# Patient Record
Sex: Female | Born: 1990 | Race: White | Hispanic: No | Marital: Married | Smoking: Never smoker
Health system: Southern US, Community
[De-identification: ages and names within clinical notes are randomized; demographics above are authoritative.]

## PROBLEM LIST (undated history)

## (undated) ENCOUNTER — Inpatient Hospital Stay (HOSPITAL_COMMUNITY): Payer: 59 | Source: Ambulatory Visit

## (undated) DIAGNOSIS — D649 Anemia, unspecified: Secondary | ICD-10-CM

---
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
## 2010-02-14 IMAGING — CT CT L SPINE W/O CM
1 series · 12 of 14 positions shown, 15 images · non-contrast
Comparison: None available

CLINICAL DATA: Lower lumbar pain for 3 weeks table.  Not on back
unable to walk the

CT LUMBAR SPINE WITHOUT CONTRAST
TECHNIQUE: Multidetector CT imaging of the lumbar spine was
performed without intravenous contrast administration. Multiplanar
CT image reconstructions were also generated.

[Series 2: lumbar spine 3.0 b30s · axial · 0.29mm/px · z∈[-382,-190]mm · 12 of 76 slices shown, 15 images]
[im 6/76  soft-tissue]
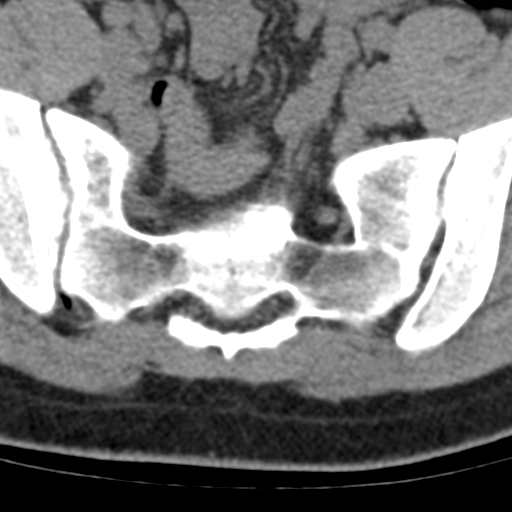
[im 6/76  bone]
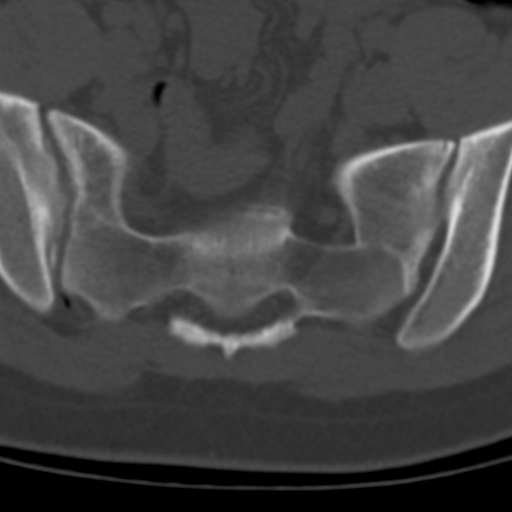
[im 12/76  bone]
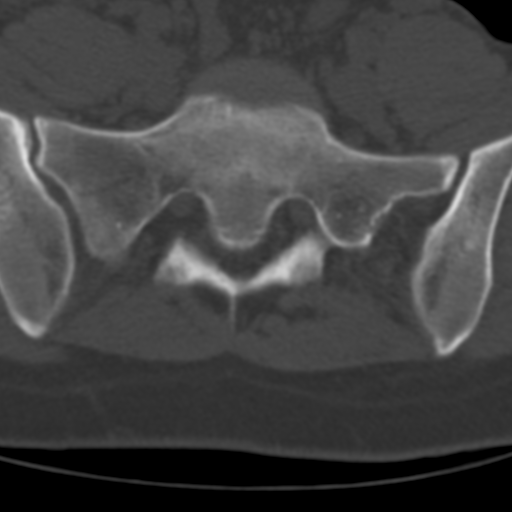
[im 18/76  bone]
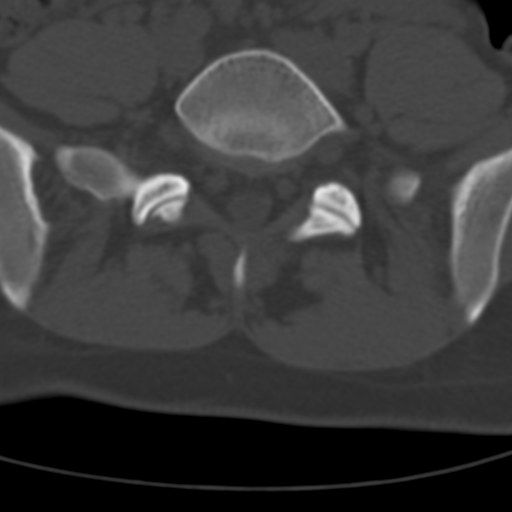
[im 24/76  bone]
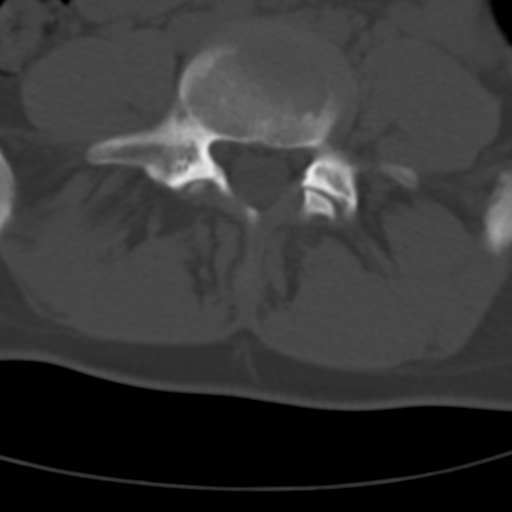
[im 29/76  soft-tissue]
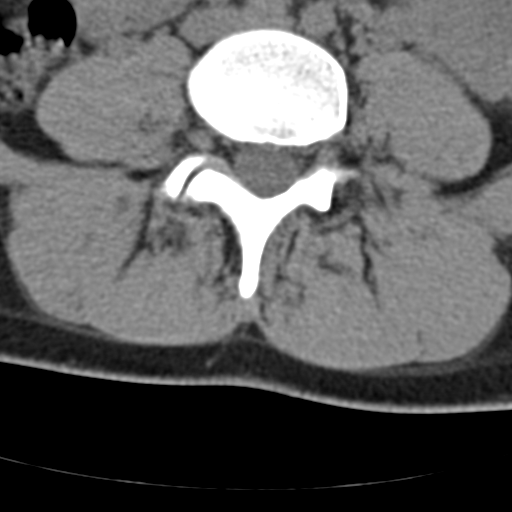
[im 29/76  bone]
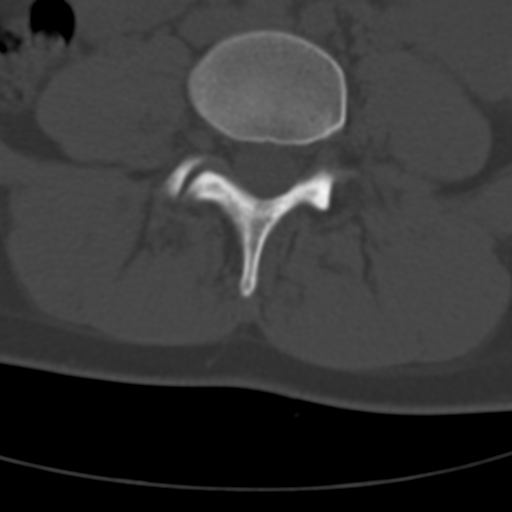
[im 35/76  bone]
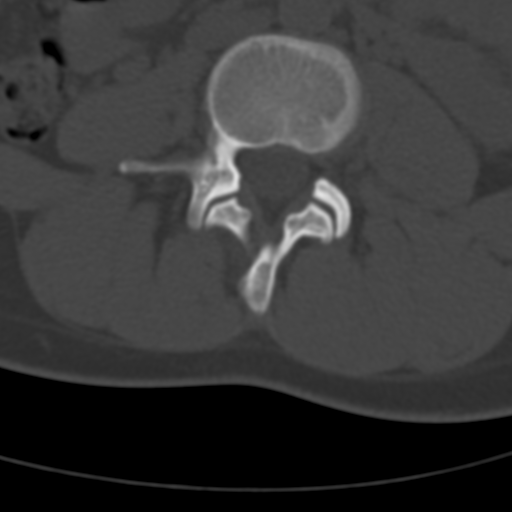
[im 41/76  bone]
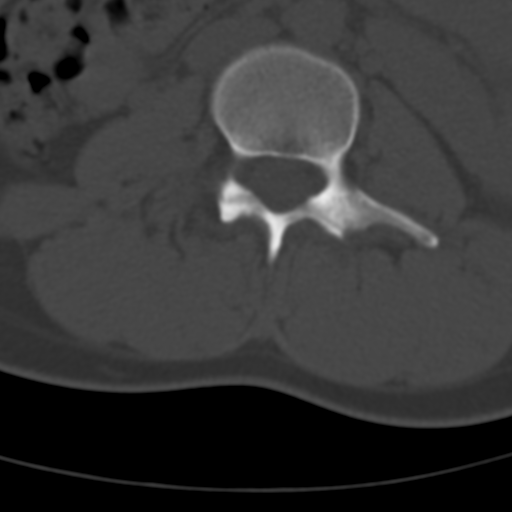
[im 47/76  bone]
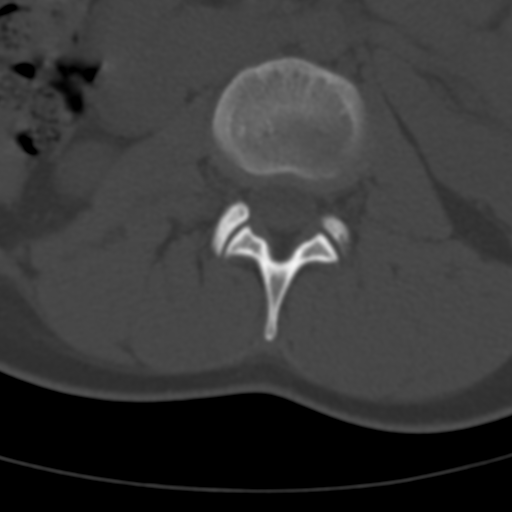
[im 52/76  soft-tissue]
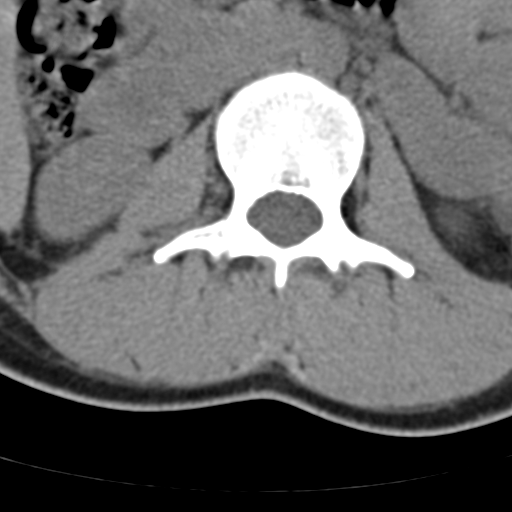
[im 52/76  bone]
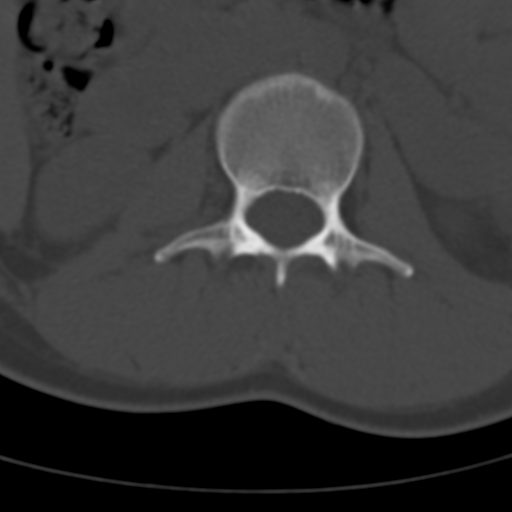
[im 58/76  bone]
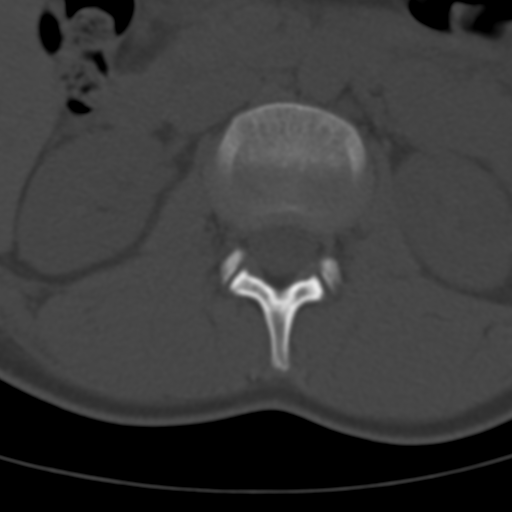
[im 64/76  bone]
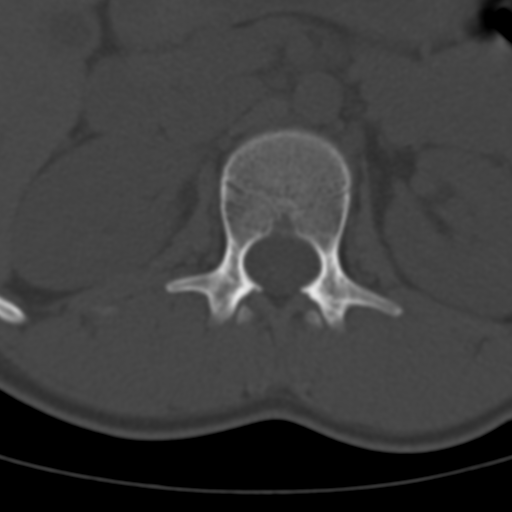
[im 70/76  bone]
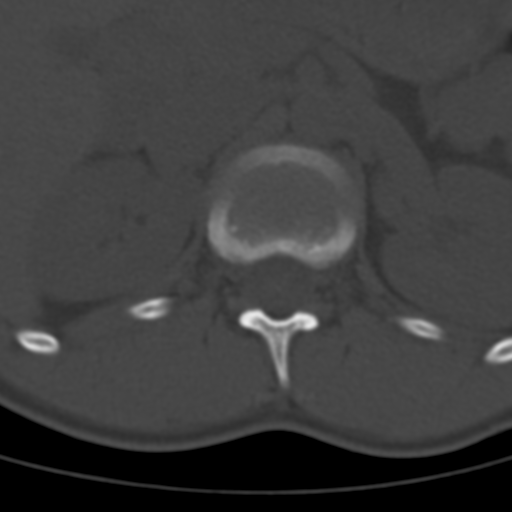

[12 of 14 positions shown; findings below may reference images not displayed]

FINDINGS: Lucencies through the  pars interarticularis at L3 on the
left and right consist with bilateral spondylolysis at this level.
No evidence of subluxation.  Normal alignment of the vertebral
bodies.  No evidence of canal stenosis.  Small left paracentral
disc bulge at L4-L5 with mild lateral recess stenosis.  The sacrum
and SI joints appear normal.
IMPRESSION: 1..  Bilateral spondylolysis at L3 without evidence of
spondylolisthesis.

2.  Small left paracentral disc bulge at L4-L5 with mild lateral
recess stenosis.

Finding conveyed to Dr Kwika 08/18/2007 .

## 2014-09-27 ENCOUNTER — Emergency Department (HOSPITAL_COMMUNITY): Payer: BLUE CROSS/BLUE SHIELD

## 2014-09-27 ENCOUNTER — Encounter (HOSPITAL_COMMUNITY): Payer: Self-pay | Admitting: *Deleted

## 2014-09-27 ENCOUNTER — Emergency Department (HOSPITAL_COMMUNITY)
Admission: EM | Admit: 2014-09-27 | Discharge: 2014-09-27 | Disposition: A | Discharge status: Home / Self Care | Payer: BLUE CROSS/BLUE SHIELD | Attending: Emergency Medicine | Admitting: Emergency Medicine

## 2014-09-27 ENCOUNTER — Emergency Department (HOSPITAL_COMMUNITY): Admission: EM | Admit: 2014-09-27 | Discharge: 2014-09-27 | Discharge status: Absent without leave | Payer: Self-pay

## 2014-09-27 DIAGNOSIS — R1030 Lower abdominal pain, unspecified: Secondary | ICD-10-CM

## 2014-09-27 DIAGNOSIS — R630 Anorexia: Secondary | ICD-10-CM | POA: Diagnosis not present

## 2014-09-27 DIAGNOSIS — N760 Acute vaginitis: Secondary | ICD-10-CM | POA: Insufficient documentation

## 2014-09-27 DIAGNOSIS — R1031 Right lower quadrant pain: Secondary | ICD-10-CM | POA: Insufficient documentation

## 2014-09-27 DIAGNOSIS — Z79899 Other long term (current) drug therapy: Secondary | ICD-10-CM | POA: Insufficient documentation

## 2014-09-27 DIAGNOSIS — B9689 Other specified bacterial agents as the cause of diseases classified elsewhere: Secondary | ICD-10-CM

## 2014-09-27 LAB — COMPREHENSIVE METABOLIC PANEL
ALK PHOS: 61 U/L (ref 38–126)
ALT: 28 U/L (ref 14–54)
ANION GAP: 9 (ref 5–15)
AST: 23 U/L (ref 15–41)
Albumin: 4.1 g/dL (ref 3.5–5.0)
BUN: 6 mg/dL (ref 6–20)
CALCIUM: 9.3 mg/dL (ref 8.9–10.3)
CO2: 27 mmol/L (ref 22–32)
CREATININE: 0.78 mg/dL (ref 0.44–1.00)
Chloride: 102 mmol/L (ref 101–111)
GFR calc Af Amer: >60 mL/min (ref >60)
GLUCOSE: 95 mg/dL (ref 65–99)
Potassium: 4.1 mmol/L (ref 3.5–5.1)
SODIUM: 138 mmol/L (ref 135–145)
Total Bilirubin: 0.6 mg/dL (ref 0.3–1.2)
Total Protein: 7 g/dL (ref 6.5–8.1)

## 2014-09-27 LAB — CBC WITH DIFFERENTIAL/PLATELET
BASOS ABS: 0 10*3/uL (ref 0.0–0.1)
BASOS PCT: 0 % (ref 0–1)
EOS PCT: 1 % (ref 0–5)
Eosinophils Absolute: 0.1 10*3/uL (ref 0.0–0.7)
HCT: 40.1 % (ref 36.0–46.0)
Hemoglobin: 14.1 g/dL (ref 12.0–15.0)
Lymphocytes Relative: 24 % (ref 12–46)
Lymphs Abs: 1.7 10*3/uL (ref 0.7–4.0)
MCH: 30.7 pg (ref 26.0–34.0)
MCHC: 35.2 g/dL (ref 30.0–36.0)
MCV: 87.4 fL (ref 78.0–100.0)
MONOS PCT: 5 % (ref 3–12)
Monocytes Absolute: 0.3 10*3/uL (ref 0.1–1.0)
Neutro Abs: 4.8 10*3/uL (ref 1.7–7.7)
Neutrophils Relative %: 70 % (ref 43–77)
PLATELETS: 170 10*3/uL (ref 150–400)
RBC: 4.59 MIL/uL (ref 3.87–5.11)
RDW: 12.1 % (ref 11.5–15.5)
WBC: 6.8 10*3/uL (ref 4.0–10.5)

## 2014-09-27 LAB — URINALYSIS, ROUTINE W REFLEX MICROSCOPIC
BILIRUBIN URINE: NEGATIVE
Glucose, UA: NEGATIVE mg/dL
Hgb urine dipstick: NEGATIVE
Ketones, ur: NEGATIVE mg/dL
LEUKOCYTES UA: NEGATIVE
NITRITE: NEGATIVE
PH: 6 (ref 5.0–8.0)
Protein, ur: NEGATIVE mg/dL
Specific Gravity, Urine: 1.006 (ref 1.005–1.030)
Urobilinogen, UA: 0.2 mg/dL (ref 0.0–1.0)

## 2014-09-27 LAB — WET PREP, GENITAL
TRICH WET PREP: NONE SEEN
Yeast Wet Prep HPF POC: NONE SEEN

## 2014-09-27 LAB — I-STAT BETA HCG BLOOD, ED (MC, WL, AP ONLY): I-stat hCG, quantitative: <5 m[IU]/mL (ref <5)

## 2014-09-27 LAB — LIPASE, BLOOD: Lipase: 15 U/L — ABNORMAL LOW (ref 22–51)

## 2014-09-27 MED ORDER — METRONIDAZOLE 500 MG PO TABS: 500.0000 mg | ORAL_TABLET | Freq: Two times a day (BID) | ORAL | Status: DC

## 2014-09-27 MED ORDER — TRAMADOL HCL 50 MG PO TABS
50.0000 mg | ORAL_TABLET | Freq: Four times a day (QID) | ORAL | Status: DC | PRN
Start: 2014-09-27 — End: 2016-02-21

## 2014-09-27 MED ORDER — ONDANSETRON 4 MG PO TBDP: ORAL_TABLET | ORAL | Status: DC

## 2014-09-27 MED ORDER — ONDANSETRON HCL 4 MG/2ML IJ SOLN
4.0000 mg | Freq: Once | INTRAMUSCULAR | Status: AC
  Administered 2014-09-27: 4 mg via INTRAVENOUS
  Filled 2014-09-27: qty 2

## 2014-09-27 MED ORDER — IOHEXOL 300 MG/ML  SOLN
100.0000 mL | Freq: Once | INTRAMUSCULAR | Status: AC | PRN
  Administered 2014-09-27: 100 mL via INTRAVENOUS

## 2014-09-27 NOTE — Discharge Instructions (Signed)
Take Flagyl twice daily for 1 week. Take tramadol as directed as needed for pain. Follow-up with your primary care physician. Take Zofran as directed as needed for nausea.  Abdominal Pain, Women Abdominal (stomach, pelvic, or belly) pain can be caused by many things. It is important to tell your doctor:  The location of the pain.  Does it come and go or is it present all the time?  Are there things that start the pain (eating certain foods, exercise)?  Are there other symptoms associated with the pain (fever, nausea, vomiting, diarrhea)? All of this is helpful to know when trying to find the cause of the pain. CAUSES   Stomach: virus or bacteria infection, or ulcer.  Intestine: appendicitis (inflamed appendix), regional ileitis (Crohn's disease), ulcerative colitis (inflamed colon), irritable bowel syndrome, diverticulitis (inflamed diverticulum of the colon), or cancer of the stomach or intestine.  Gallbladder disease or stones in the gallbladder.  Kidney disease, kidney stones, or infection.  Pancreas infection or cancer.  Fibromyalgia (pain disorder).  Diseases of the female organs:  Uterus: fibroid (non-cancerous) tumors or infection.  Fallopian tubes: infection or tubal pregnancy.  Ovary: cysts or tumors.  Pelvic adhesions (scar tissue).  Endometriosis (uterus lining tissue growing in the pelvis and on the pelvic organs).  Pelvic congestion syndrome (female organs filling up with blood just before the menstrual period).  Pain with the menstrual period.  Pain with ovulation (producing an egg).  Pain with an IUD (intrauterine device, birth control) in the uterus.  Cancer of the female organs.  Functional pain (pain not caused by a disease, may improve without treatment).  Psychological pain.  Depression. DIAGNOSIS  Your doctor will decide the seriousness of your pain by doing an examination.  Blood tests.  X-rays.  Ultrasound.  CT scan (computed  tomography, special type of X-ray).  MRI (magnetic resonance imaging).  Cultures, for infection.  Barium enema (dye inserted in the large intestine, to better view it with X-rays).  Colonoscopy (looking in intestine with a lighted tube).  Laparoscopy (minor surgery, looking in abdomen with a lighted tube).  Major abdominal exploratory surgery (looking in abdomen with a large incision). TREATMENT  The treatment will depend on the cause of the pain.   Many cases can be observed and treated at home.  Over-the-counter medicines recommended by your caregiver.  Prescription medicine.  Antibiotics, for infection.  Birth control pills, for painful periods or for ovulation pain.  Hormone treatment, for endometriosis.  Nerve blocking injections.  Physical therapy.  Antidepressants.  Counseling with a psychologist or psychiatrist.  Minor or major surgery. HOME CARE INSTRUCTIONS   Do not take laxatives, unless directed by your caregiver.  Take over-the-counter pain medicine only if ordered by your caregiver. Do not take aspirin because it can cause an upset stomach or bleeding.  Try a clear liquid diet (broth or water) as ordered by your caregiver. Slowly move to a bland diet, as tolerated, if the pain is related to the stomach or intestine.  Have a thermometer and take your temperature several times a day, and record it.  Bed rest and sleep, if it helps the pain.  Avoid sexual intercourse, if it causes pain.  Avoid stressful situations.  Keep your follow-up appointments and tests, as your caregiver orders.  If the pain does not go away with medicine or surgery, you may try:  Acupuncture.  Relaxation exercises (yoga, meditation).  Group therapy.  Counseling. SEEK MEDICAL CARE IF:   You notice certain foods  cause stomach pain.  Your home care treatment is not helping your pain.  You need stronger pain medicine.  You want your IUD removed.  You feel faint  or lightheaded.  You develop nausea and vomiting.  You develop a rash.  You are having side effects or an allergy to your medicine. SEEK IMMEDIATE MEDICAL CARE IF:   Your pain does not go away or gets worse.  You have a fever.  Your pain is felt only in portions of the abdomen. The right side could possibly be appendicitis. The left lower portion of the abdomen could be colitis or diverticulitis.  You are passing blood in your stools (bright red or black tarry stools, with or without vomiting).  You have blood in your urine.  You develop chills, with or without a fever.  You pass out. MAKE SURE YOU:   Understand these instructions.  Will watch your condition.  Will get help right away if you are not doing well or get worse. Document Released: 02/09/2007 Document Revised: 08/29/2013 Document Reviewed: 03/01/2009 [facility] Patient Information 2015 [place], [place]. This information is not intended to replace advice given to you by your health care provider. Make sure you discuss any questions you have with your health care provider.  Bacterial Vaginosis Bacterial vaginosis is a vaginal infection that occurs when the normal balance of bacteria in the vagina is disrupted. It results from an overgrowth of certain bacteria. This is the most common vaginal infection in women of childbearing age. Treatment is important to prevent complications, especially in pregnant women, as it can cause a premature delivery. CAUSES  Bacterial vaginosis is caused by an increase in harmful bacteria that are normally present in smaller amounts in the vagina. Several different kinds of bacteria can cause bacterial vaginosis. However, the reason that the condition develops is not fully understood. RISK FACTORS Certain activities or behaviors can put you at an increased risk of developing bacterial vaginosis, including:  Having a new sex partner or multiple sex partners.  Douching.  Using an  intrauterine device (IUD) for contraception. Women do not get bacterial vaginosis from toilet seats, bedding, swimming pools, or contact with objects around them. SIGNS AND SYMPTOMS  Some women with bacterial vaginosis have no signs or symptoms. Common symptoms include:  Grey vaginal discharge.  A fishlike odor with discharge, especially after sexual intercourse.  Itching or burning of the vagina and vulva.  Burning or pain with urination. DIAGNOSIS  Your health care provider will take a medical history and examine the vagina for signs of bacterial vaginosis. A sample of vaginal fluid may be taken. Your health care provider will look at this sample under a microscope to check for bacteria and abnormal cells. A vaginal pH test may also be done.  TREATMENT  Bacterial vaginosis may be treated with antibiotic medicines. These may be given in the form of a pill or a vaginal cream. A second round of antibiotics may be prescribed if the condition comes back after treatment.  HOME CARE INSTRUCTIONS   Only take over-the-counter or prescription medicines as directed by your health care provider.  If antibiotic medicine was prescribed, take it as directed. Make sure you finish it even if you start to feel better.  Do not have sex until treatment is completed.  Tell all sexual partners that you have a vaginal infection. They should see their health care provider and be treated if they have problems, such as a mild rash or itching.  Practice safe  sex by using condoms and only having one sex partner. SEEK MEDICAL CARE IF:   Your symptoms are not improving after 3 days of treatment.  You have increased discharge or pain.  You have a fever. MAKE SURE YOU:   Understand these instructions.  Will watch your condition.  Will get help right away if you are not doing well or get worse. FOR MORE INFORMATION  Centers for Disease Control and Prevention, Division of STD Prevention:  [employer] American Sexual Health Association (ASHA): www.ashastd.org  Document Released: 04/14/2005 Document Revised: 02/02/2013 Document Reviewed: 11/24/2012 [facility] Patient Information 2015 [place], [place]. This information is not intended to replace advice given to you by your health care provider. Make sure you discuss any questions you have with your health care provider.

## 2014-09-27 NOTE — ED Notes (Signed)
Pt declined pain meds at this time.

## 2014-09-27 NOTE — ED Provider Notes (Signed)
CSN: [id number]     Arrival date & time 09/27/14  1345 History  This chart was scribed for non-physician practitioner, Celene Skeen, working with Zadie Rhine, MD by Richarda Overlie, ED Scribe. This patient was seen in room TR02C/TR02C and the patient's care was started at 3:22 PM.  Chief Complaint  Patient presents with  . Abdominal Pain   The history is provided by the patient. No language interpreter was used.   HPI Comments: Cathy Pineda is a 24 y.o. female who presents to the Emergency Department complaining of constant, right lower abodminal pain that started last night. Pt states that she experiences constant, dull pain with intermittent sharp pain in her RLQ. She rates her pain as a 5/10 at this time. Pt states that since onset her pain has radiated to her right flank and right lower back. She reports associated intermittent nausea and states that she has not eaten today. Pt states that she experienced some burning with urination last night but has not experienced this today. Pt reports that she has noticed some minimal, clear vaginal discharge but denies any colored or odorous discharge. She states she had her period on August 08, 2014 but did not have her period in May. She states that her periods are typically irregular and says that she stopped taking her birth control, Implanon, in February of 2016. Pt reports that she is sexually active with her husband with no protection. She reports that she has taken pregnancy tests since onset which have all been negative. She denies fever, diarrhea or vomiting.    History reviewed. No pertinent past medical history. History reviewed. No pertinent past surgical history. No family history on file. History  Substance Use Topics  . Smoking status: Never Smoker   . Smokeless tobacco: Not on file  . Alcohol Use: Yes   OB History    No data available     Review of Systems  Constitutional: Positive for appetite change. Negative for fever.   Gastrointestinal: Positive for nausea and abdominal pain. Negative for vomiting and diarrhea.  Genitourinary: Positive for dysuria, flank pain and vaginal discharge. Negative for frequency.  Musculoskeletal: Positive for back pain.  All other systems reviewed and are negative.  Allergies  Review of patient's allergies indicates no known allergies.  Home Medications   Prior to Admission medications   Medication Sig Start Date End Date Taking? Authorizing Provider  metroNIDAZOLE (FLAGYL) 500 MG tablet Take 1 tablet (500 mg total) by mouth 2 (two) times daily. One po bid x 7 days 09/27/14   Kathrynn Speed, PA-C  ondansetron (ZOFRAN ODT) 4 MG disintegrating tablet  ODT q4 hours prn nausea/vomit 09/27/14   Breelyn Icard M Vincen Bejar, PA-C  traMADol (ULTRAM) 50 MG tablet Take 1 tablet (50 mg total) by mouth every 6 (six) hours as needed. 09/27/14   Antwanette Wesche M Jayvian Escoe, PA-C   BP 109/68 mmHg  Pulse 61  Temp(Src) 97.7 F (36.5 C) (Oral)  Resp 16  Ht  (1.626 m)  Wt 225 lb (102.059 kg)  BMI 38.60 kg/m2  SpO2 100%  LMP 08/08/2014 Physical Exam  Constitutional: She is oriented to person, place, and time. She appears well-developed and well-nourished. No distress.  Obese.  HENT:  Head: Normocephalic and atraumatic.  Mouth/Throat: Oropharynx is clear and moist.  Eyes: Conjunctivae and EOM are normal.  Neck: Normal range of motion. Neck supple.  Cardiovascular: Normal rate, regular rhythm and normal heart sounds.   Pulmonary/Chest: Effort normal and breath sounds normal. No  respiratory distress.  Abdominal: Normal appearance and bowel sounds are normal. She exhibits no distension. There is tenderness in the right lower quadrant and suprapubic area. There is no rigidity, no rebound, no guarding, no CVA tenderness, no tenderness at McBurney's point and negative Murphy's sign.  No peritoneal signs.  Genitourinary: Uterus normal. Cervix exhibits no motion tenderness, no discharge and no friability. Right adnexum  displays tenderness. Left adnexum displays no tenderness. No erythema, tenderness or bleeding in the vagina. Vaginal discharge (white) found.  Musculoskeletal: Normal range of motion. She exhibits no edema.  Neurological: She is alert and oriented to person, place, and time. No sensory deficit.  Skin: Skin is warm and dry.  Psychiatric: She has a normal mood and affect. Her behavior is normal.  Nursing note and vitals reviewed.   ED Course  Procedures   DIAGNOSTIC STUDIES: Oxygen Saturation is 99% on RA, normal by my interpretation.    COORDINATION OF CARE: 3:31 PM Discussed treatment plan with pt at bedside and pt agreed to plan.  Labs Review Labs Reviewed  WET PREP, GENITAL - Abnormal; Notable for the following:    Clue Cells Wet Prep HPF POC FEW (*)    WBC, Wet Prep HPF POC FEW (*)    All other components within normal limits  LIPASE, BLOOD - Abnormal; Notable for the following:    Lipase 15 (*)    All other components within normal limits  CBC WITH DIFFERENTIAL/PLATELET  COMPREHENSIVE METABOLIC PANEL  URINALYSIS, ROUTINE W REFLEX MICROSCOPIC (NOT AT [facility])  I-STAT BETA HCG BLOOD, ED ([facility], [facility], [facility] ONLY)  GC/CHLAMYDIA PROBE AMP ([place]) NOT AT [facility]    Imaging Review [place] Transvaginal Non-ob  09/27/2014   CLINICAL DATA:  Right lower quadrant abdominal pain for 1 day. Nausea.  EXAM: TRANSABDOMINAL AND TRANSVAGINAL ULTRASOUND OF PELVIS  TECHNIQUE: Both transabdominal and transvaginal ultrasound examinations of the pelvis were performed. Transabdominal technique was performed for global imaging of the pelvis including uterus, ovaries, adnexal regions, and pelvic cul-de-sac. It was necessary to proceed with endovaginal exam following the transabdominal exam to visualize the ovaries.  COMPARISON:  None  FINDINGS: Uterus  Measurements: 7.6 by 3.1 by 4.0 cm. No fibroids or other mass visualized.  Endometrium  Thickness: 5 mm.  No focal abnormality visualized.  Right ovary  Measurements:  2.8 by 2.3 by 2.1 cm. Normal appearance/no adnexal mass.  Left ovary  Measurements: 2.9 by 2.0 by 2.2 cm. Normal appearance/no adnexal mass.  Other findings  Trace free pelvic fluid in the cul-de-sac.  IMPRESSION: 1. Trace free pelvic fluid in the cul-de-sac, probably physiologic. Otherwise normal exam.   Electronically Signed   By: Gaylyn RongWalter  Liebkemann M.D.   On: 09/27/2014 17:07   [place] Pelvis Complete  09/27/2014   CLINICAL DATA:  Right lower quadrant abdominal pain for 1 day. Nausea.  EXAM: TRANSABDOMINAL AND TRANSVAGINAL ULTRASOUND OF PELVIS  TECHNIQUE: Both transabdominal and transvaginal ultrasound examinations of the pelvis were performed. Transabdominal technique was performed for global imaging of the pelvis including uterus, ovaries, adnexal regions, and pelvic cul-de-sac. It was necessary to proceed with endovaginal exam following the transabdominal exam to visualize the ovaries.  COMPARISON:  None  FINDINGS: Uterus  Measurements: 7.6 by 3.1 by 4.0 cm. No fibroids or other mass visualized.  Endometrium  Thickness: 5 mm.  No focal abnormality visualized.  Right ovary  Measurements: 2.8 by 2.3 by 2.1 cm. Normal appearance/no adnexal mass.  Left ovary  Measurements: 2.9 by 2.0 by 2.2 cm.  Normal appearance/no adnexal mass.  Other findings  Trace free pelvic fluid in the cul-de-sac.  IMPRESSION: 1. Trace free pelvic fluid in the cul-de-sac, probably physiologic. Otherwise normal exam.   Electronically Signed   By: Gaylyn Rong M.D.   On: 09/27/2014 17:07   Ct Abdomen Pelvis W Contrast  09/27/2014   CLINICAL DATA:  Right-sided abdominal pain.  EXAM: CT ABDOMEN AND PELVIS WITH CONTRAST  TECHNIQUE: Multidetector CT imaging of the abdomen and pelvis was performed using the standard protocol following bolus administration of intravenous contrast.  CONTRAST:  OMNIPAQUE IOHEXOL 300 MG/ML  SOLN  COMPARISON:  Pelvic ultrasound earlier today.  FINDINGS: The liver, gallbladder, pancreas, spleen, adrenal  glands and kidneys are within normal limits. Visualized lung bases are unremarkable.  Bowel shows no evidence of obstruction or inflammation. There is no evidence of acute appendicitis. No masses or enlarged lymph nodes are seen. No vascular abnormalities are identified. No hernias are seen. The uterus and adnexal regions appear unremarkable by CT. The bladder is decompressed and unremarkable in appearance. Bony structures are normal.  IMPRESSION: No acute findings in the abdomen or pelvis.   Electronically Signed   By: Irish Lack M.D.   On: 09/27/2014 19:09     EKG Interpretation None      MDM   Final diagnoses:  BV (bacterial vaginosis)  RLQ abdominal pain   Nontoxic appearing, NAD. AF VSS. Abdomen is soft with no peritoneal signs. Tenderness in right lower quadrant and suprapubic, no tenderness at McBurney's point. No fevers or leukocytosis, suspicion low for appendicitis. Initial suspicion for GYN pathology. On pelvic exam, right adnexal tenderness noted. No CMT. Doubt PID. Monogamous relationship with her husband. No significant vaginal discharge, just scant white. Ultrasound obtained, trace free fluid in pelvic cul-de-sac, no other acute findings. 5:23 PM On reexamination after ultrasound, patient reports she is still experiencing the pain, slightly nauseated. On exam, abdomen still with right lower quadrant tenderness. Will obtain CT for further evaluation. 7:28 PM CT negative for any acute finding. Discussed this with patient, and discussed that this may be from. Cramps given that her periods are regular and this could be coming on. Will discharge home with Flagyl, tramadol Zofran. Advised her to follow-up with PCP. Stable for discharge. Return precautions given. Patient states understanding of treatment care plan and is agreeable.  I personally performed the services described in this documentation, which was scribed in my presence. The recorded information has been reviewed and is  accurate.    Kathrynn Speed, PA-C 09/27/14 1928  Zadie Rhine, MD 09/28/14 [phone]

## 2014-09-27 NOTE — ED Notes (Signed)
Pt reports lower rt side abdominal pain that started last night. Pt states that she missed her period in may but has taken several tests and were negative. Pt states that she was sent here by an [facility] for eval. Pt reports nausea and pain that radiates to back.

## 2014-09-28 LAB — GC/CHLAMYDIA PROBE AMP (~~LOC~~) NOT AT ARMC
Chlamydia: NEGATIVE
Neisseria Gonorrhea: NEGATIVE

## 2016-02-05 LAB — OB RESULTS CONSOLE GBS: GBS: NEGATIVE

## 2016-02-21 ENCOUNTER — Inpatient Hospital Stay (HOSPITAL_COMMUNITY): Payer: BLUE CROSS/BLUE SHIELD

## 2016-02-21 ENCOUNTER — Inpatient Hospital Stay (HOSPITAL_COMMUNITY): Payer: BLUE CROSS/BLUE SHIELD | Admitting: Anesthesiology

## 2016-02-21 ENCOUNTER — Inpatient Hospital Stay (HOSPITAL_COMMUNITY)
Admission: AD | Admit: 2016-02-21 | Discharge: 2016-02-24 | DRG: 765 | Disposition: A | Discharge status: Home / Self Care | Payer: BLUE CROSS/BLUE SHIELD | Source: Ambulatory Visit | Attending: Obstetrics and Gynecology | Admitting: Obstetrics and Gynecology

## 2016-02-21 ENCOUNTER — Encounter (HOSPITAL_COMMUNITY)
Admission: AD | Disposition: A | Discharge status: Home / Self Care | Payer: Self-pay | Source: Ambulatory Visit | Attending: Obstetrics and Gynecology

## 2016-02-21 ENCOUNTER — Encounter (HOSPITAL_COMMUNITY): Payer: Self-pay

## 2016-02-21 DIAGNOSIS — O99214 Obesity complicating childbirth: Secondary | ICD-10-CM | POA: Diagnosis present

## 2016-02-21 DIAGNOSIS — O328XX0 Maternal care for other malpresentation of fetus, not applicable or unspecified: Principal | ICD-10-CM | POA: Diagnosis present

## 2016-02-21 DIAGNOSIS — O321XX0 Maternal care for breech presentation, not applicable or unspecified: Secondary | ICD-10-CM | POA: Diagnosis present

## 2016-02-21 DIAGNOSIS — O42013 Preterm premature rupture of membranes, onset of labor within 24 hours of rupture, third trimester: Secondary | ICD-10-CM | POA: Diagnosis present

## 2016-02-21 DIAGNOSIS — Z3A37 37 weeks gestation of pregnancy: Secondary | ICD-10-CM | POA: Diagnosis not present

## 2016-02-21 DIAGNOSIS — Z6841 Body Mass Index (BMI) 40.0 and over, adult: Secondary | ICD-10-CM

## 2016-02-21 DIAGNOSIS — Z3689 Encounter for other specified antenatal screening: Secondary | ICD-10-CM

## 2016-02-21 LAB — ABO/RH: ABO/RH(D): A POS

## 2016-02-21 LAB — TYPE AND SCREEN
ABO/RH(D): A POS
Antibody Screen: NEGATIVE

## 2016-02-21 LAB — CBC
HEMATOCRIT: 35.2 % — AB (ref 36.0–46.0)
Hemoglobin: 12.3 g/dL (ref 12.0–15.0)
MCH: 31.3 pg (ref 26.0–34.0)
MCHC: 34.9 g/dL (ref 30.0–36.0)
MCV: 89.6 fL (ref 78.0–100.0)
Platelets: 175 10*3/uL (ref 150–400)
RBC: 3.93 MIL/uL (ref 3.87–5.11)
RDW: 12.8 % (ref 11.5–15.5)
WBC: 10.8 10*3/uL — AB (ref 4.0–10.5)

## 2016-02-21 LAB — POCT FERN TEST: POCT FERN TEST: POSITIVE

## 2016-02-21 SURGERY — Surgical Case
Anesthesia: Spinal

## 2016-02-21 MED ORDER — COCONUT OIL OIL
1.0000 "application " | TOPICAL_OIL | Status: DC | PRN
  Filled 2016-02-21: qty 120

## 2016-02-21 MED ORDER — SENNOSIDES-DOCUSATE SODIUM 8.6-50 MG PO TABS
2.0000 | ORAL_TABLET | ORAL | Status: DC
  Administered 2016-02-21 – 2016-02-23 (×3): 2 via ORAL
  Filled 2016-02-21 (×6): qty 2

## 2016-02-21 MED ORDER — BUPIVACAINE HCL (PF) 0.25 % IJ SOLN
INTRAMUSCULAR | Status: DC | PRN
  Administered 2016-02-21: 20 mL

## 2016-02-21 MED ORDER — ONDANSETRON HCL 4 MG/2ML IJ SOLN
INTRAMUSCULAR | Status: AC
  Filled 2016-02-21: qty 2

## 2016-02-21 MED ORDER — OXYCODONE-ACETAMINOPHEN 5-325 MG PO TABS
2.0000 | ORAL_TABLET | ORAL | Status: DC | PRN
  Administered 2016-02-23 – 2016-02-24 (×7): 2 via ORAL
  Filled 2016-02-21 (×7): qty 2

## 2016-02-21 MED ORDER — SCOPOLAMINE 1 MG/3DAYS TD PT72
MEDICATED_PATCH | TRANSDERMAL | Status: DC | PRN
  Administered 2016-02-21: 1 via TRANSDERMAL

## 2016-02-21 MED ORDER — ACETAMINOPHEN 500 MG PO TABS
1000.0000 mg | ORAL_TABLET | Freq: Four times a day (QID) | ORAL | Status: AC
  Administered 2016-02-21 – 2016-02-22 (×4): 1000 mg via ORAL
  Filled 2016-02-21 (×4): qty 2

## 2016-02-21 MED ORDER — PRENATAL MULTIVITAMIN CH
1.0000 | ORAL_TABLET | Freq: Every day | ORAL | Status: DC
Start: 2016-02-21 — End: 2016-02-24
  Administered 2016-02-22 – 2016-02-24 (×3): 1 via ORAL
  Filled 2016-02-21 (×5): qty 1

## 2016-02-21 MED ORDER — LACTATED RINGERS IV BOLUS (SEPSIS): 1000.0000 mL | Freq: Once | INTRAVENOUS | Status: DC

## 2016-02-21 MED ORDER — PHENYLEPHRINE 8 MG IN D5W 100 ML (0.08MG/ML) PREMIX OPTIME
INJECTION | INTRAVENOUS | Status: AC
  Filled 2016-02-21: qty 100

## 2016-02-21 MED ORDER — LIDOCAINE HCL (PF) 1 % IJ SOLN
INTRAMUSCULAR | Status: AC
  Filled 2016-02-21: qty 5

## 2016-02-21 MED ORDER — SODIUM CHLORIDE 0.9% FLUSH: 3.0000 mL | INTRAVENOUS | Status: DC | PRN

## 2016-02-21 MED ORDER — LACTATED RINGERS IV SOLN
INTRAVENOUS | Status: DC
  Administered 2016-02-21: 09:00:00 via INTRAVENOUS

## 2016-02-21 MED ORDER — DIBUCAINE 1 % RE OINT: 1.0000 "application " | TOPICAL_OINTMENT | RECTAL | Status: DC | PRN

## 2016-02-21 MED ORDER — CEFAZOLIN SODIUM-DEXTROSE 2-4 GM/100ML-% IV SOLN
2.0000 g | INTRAVENOUS | Status: AC
  Administered 2016-02-21: 2 g via INTRAVENOUS
  Filled 2016-02-21: qty 100

## 2016-02-21 MED ORDER — FENTANYL CITRATE (PF) 100 MCG/2ML IJ SOLN: 25.0000 ug | INTRAMUSCULAR | Status: DC | PRN

## 2016-02-21 MED ORDER — SIMETHICONE 80 MG PO CHEW
80.0000 mg | CHEWABLE_TABLET | Freq: Three times a day (TID) | ORAL | Status: DC
  Administered 2016-02-21 – 2016-02-24 (×8): 80 mg via ORAL
  Filled 2016-02-21 (×13): qty 1

## 2016-02-21 MED ORDER — NALBUPHINE HCL 10 MG/ML IJ SOLN: 5.0000 mg | INTRAMUSCULAR | Status: DC | PRN

## 2016-02-21 MED ORDER — MORPHINE SULFATE-NACL 0.5-0.9 MG/ML-% IV SOSY
PREFILLED_SYRINGE | INTRAVENOUS | Status: AC
  Filled 2016-02-21: qty 1

## 2016-02-21 MED ORDER — SOD CITRATE-CITRIC ACID 500-334 MG/5ML PO SOLN
30.0000 mL | Freq: Once | ORAL | Status: AC
  Administered 2016-02-21: 30 mL via ORAL
  Filled 2016-02-21: qty 15

## 2016-02-21 MED ORDER — SIMETHICONE 80 MG PO CHEW
80.0000 mg | CHEWABLE_TABLET | ORAL | Status: DC | PRN
  Filled 2016-02-21: qty 1

## 2016-02-21 MED ORDER — BUPIVACAINE HCL (PF) 0.25 % IJ SOLN
INTRAMUSCULAR | Status: AC
  Filled 2016-02-21: qty 10

## 2016-02-21 MED ORDER — SCOPOLAMINE 1 MG/3DAYS TD PT72: 1.0000 | MEDICATED_PATCH | Freq: Once | TRANSDERMAL | Status: DC

## 2016-02-21 MED ORDER — METHYLERGONOVINE MALEATE 0.2 MG/ML IJ SOLN: 0.2000 mg | INTRAMUSCULAR | Status: DC | PRN

## 2016-02-21 MED ORDER — WITCH HAZEL-GLYCERIN EX PADS: 1.0000 "application " | MEDICATED_PAD | CUTANEOUS | Status: DC | PRN

## 2016-02-21 MED ORDER — OXYCODONE-ACETAMINOPHEN 5-325 MG PO TABS
1.0000 | ORAL_TABLET | ORAL | Status: DC | PRN
  Administered 2016-02-22 – 2016-02-24 (×2): 1 via ORAL
  Filled 2016-02-21 (×2): qty 1

## 2016-02-21 MED ORDER — NALBUPHINE HCL 10 MG/ML IJ SOLN: 5.0000 mg | Freq: Once | INTRAMUSCULAR | Status: DC | PRN

## 2016-02-21 MED ORDER — ONDANSETRON HCL 4 MG/2ML IJ SOLN
4.0000 mg | Freq: Three times a day (TID) | INTRAMUSCULAR | Status: DC | PRN
Start: 2016-02-21 — End: 2016-02-24

## 2016-02-21 MED ORDER — PHENYLEPHRINE 8 MG IN D5W 100 ML (0.08MG/ML) PREMIX OPTIME
INJECTION | INTRAVENOUS | Status: DC | PRN
  Administered 2016-02-21: 60 ug/min via INTRAVENOUS

## 2016-02-21 MED ORDER — SIMETHICONE 80 MG PO CHEW
80.0000 mg | CHEWABLE_TABLET | ORAL | Status: DC
  Administered 2016-02-21 – 2016-02-23 (×3): 80 mg via ORAL
  Filled 2016-02-21 (×5): qty 1

## 2016-02-21 MED ORDER — MENTHOL 3 MG MT LOZG
1.0000 | LOZENGE | OROMUCOSAL | Status: DC | PRN
  Filled 2016-02-21 (×2): qty 9

## 2016-02-21 MED ORDER — FAMOTIDINE IN NACL 20-0.9 MG/50ML-% IV SOLN
20.0000 mg | Freq: Once | INTRAVENOUS | Status: AC
  Administered 2016-02-21: 20 mg via INTRAVENOUS
  Filled 2016-02-21: qty 50

## 2016-02-21 MED ORDER — OXYTOCIN 10 UNIT/ML IJ SOLN
INTRAMUSCULAR | Status: AC
  Filled 2016-02-21: qty 4

## 2016-02-21 MED ORDER — MEPERIDINE HCL 25 MG/ML IJ SOLN: 6.2500 mg | INTRAMUSCULAR | Status: DC | PRN

## 2016-02-21 MED ORDER — TETANUS-DIPHTH-ACELL PERTUSSIS 5-2.5-18.5 LF-MCG/0.5 IM SUSP
0.5000 mL | Freq: Once | INTRAMUSCULAR | Status: DC
  Filled 2016-02-21: qty 0.5

## 2016-02-21 MED ORDER — ZOLPIDEM TARTRATE 5 MG PO TABS: 5.0000 mg | ORAL_TABLET | Freq: Every evening | ORAL | Status: DC | PRN

## 2016-02-21 MED ORDER — DIPHENHYDRAMINE HCL 25 MG PO CAPS
25.0000 mg | ORAL_CAPSULE | ORAL | Status: DC | PRN
  Filled 2016-02-21 (×2): qty 1

## 2016-02-21 MED ORDER — LACTATED RINGERS IV SOLN: INTRAVENOUS | Status: DC

## 2016-02-21 MED ORDER — OXYTOCIN 40 UNITS IN LACTATED RINGERS INFUSION - SIMPLE MED: 2.5000 [IU]/h | INTRAVENOUS | Status: AC

## 2016-02-21 MED ORDER — NALOXONE HCL 2 MG/2ML IJ SOSY
1.0000 ug/kg/h | PREFILLED_SYRINGE | INTRAVENOUS | Status: DC | PRN
  Filled 2016-02-21: qty 2

## 2016-02-21 MED ORDER — FENTANYL CITRATE (PF) 100 MCG/2ML IJ SOLN
INTRAMUSCULAR | Status: AC
  Filled 2016-02-21: qty 2

## 2016-02-21 MED ORDER — ACETAMINOPHEN 325 MG PO TABS
650.0000 mg | ORAL_TABLET | ORAL | Status: DC | PRN
  Administered 2016-02-22: 650 mg via ORAL
  Filled 2016-02-21: qty 2

## 2016-02-21 MED ORDER — DIPHENHYDRAMINE HCL 50 MG/ML IJ SOLN: 12.5000 mg | INTRAMUSCULAR | Status: DC | PRN

## 2016-02-21 MED ORDER — LACTATED RINGERS IV SOLN
INTRAVENOUS | Status: DC | PRN
  Administered 2016-02-21: 09:00:00 via INTRAVENOUS

## 2016-02-21 MED ORDER — DIPHENHYDRAMINE HCL 25 MG PO CAPS
25.0000 mg | ORAL_CAPSULE | Freq: Four times a day (QID) | ORAL | Status: DC | PRN
  Filled 2016-02-21: qty 1

## 2016-02-21 MED ORDER — METHYLERGONOVINE MALEATE 0.2 MG PO TABS: 0.2000 mg | ORAL_TABLET | ORAL | Status: DC | PRN

## 2016-02-21 MED ORDER — IBUPROFEN 600 MG PO TABS
600.0000 mg | ORAL_TABLET | Freq: Four times a day (QID) | ORAL | Status: DC
  Administered 2016-02-21 – 2016-02-24 (×12): 600 mg via ORAL
  Filled 2016-02-21 (×12): qty 1

## 2016-02-21 MED ORDER — SCOPOLAMINE 1 MG/3DAYS TD PT72
MEDICATED_PATCH | TRANSDERMAL | Status: AC
  Filled 2016-02-21: qty 1

## 2016-02-21 MED ORDER — ONDANSETRON HCL 4 MG/2ML IJ SOLN
INTRAMUSCULAR | Status: DC | PRN
  Administered 2016-02-21: 4 mg via INTRAVENOUS

## 2016-02-21 MED ORDER — NALOXONE HCL 0.4 MG/ML IJ SOLN: 0.4000 mg | INTRAMUSCULAR | Status: DC | PRN

## 2016-02-21 MED ORDER — LACTATED RINGERS IV SOLN
INTRAVENOUS | Status: DC | PRN
  Administered 2016-02-21: 40 [IU] via INTRAVENOUS

## 2016-02-21 SURGICAL SUPPLY — 38 items
BENZOIN TINCTURE PRP APPL 2/3 (GAUZE/BANDAGES/DRESSINGS) ×3 IMPLANT
CHLORAPREP W/TINT 26ML (MISCELLANEOUS) ×3 IMPLANT
CLAMP CORD UMBIL (MISCELLANEOUS) IMPLANT
CLOSURE STERI STRIP 1/2 X4 (GAUZE/BANDAGES/DRESSINGS) ×3 IMPLANT
CLOTH BEACON ORANGE TIMEOUT ST (SAFETY) ×3 IMPLANT
CONTAINER PREFILL 10% NBF 15ML (MISCELLANEOUS) IMPLANT
DECANTER SPIKE VIAL GLASS SM (MISCELLANEOUS) ×3 IMPLANT
DRSG OPSITE POSTOP 4X10 (GAUZE/BANDAGES/DRESSINGS) ×3 IMPLANT
ELECT REM PT RETURN 9FT ADLT (ELECTROSURGICAL) ×3
ELECTRODE REM PT RTRN 9FT ADLT (ELECTROSURGICAL) ×1 IMPLANT
EXTRACTOR VACUUM M CUP 4 TUBE (SUCTIONS) IMPLANT
EXTRACTOR VACUUM M CUP 4' TUBE (SUCTIONS)
GLOVE BIO SURGEON STRL SZ7.5 (GLOVE) ×3 IMPLANT
GLOVE BIOGEL PI IND STRL 7.0 (GLOVE) ×1 IMPLANT
GLOVE BIOGEL PI INDICATOR 7.0 (GLOVE) ×2
GOWN STRL REUS W/TWL LRG LVL3 (GOWN DISPOSABLE) ×6 IMPLANT
KIT ABG SYR 3ML LUER SLIP (SYRINGE) IMPLANT
NEEDLE HYPO 22GX1.5 SAFETY (NEEDLE) ×6 IMPLANT
NEEDLE HYPO 25X5/8 SAFETYGLIDE (NEEDLE) IMPLANT
NEEDLE SPNL 20GX3.5 QUINCKE YW (NEEDLE) IMPLANT
NS IRRIG 1000ML POUR BTL (IV SOLUTION) ×3 IMPLANT
PACK C SECTION WH (CUSTOM PROCEDURE TRAY) ×3 IMPLANT
PAD ABD 7.5X8 STRL (GAUZE/BANDAGES/DRESSINGS) ×3 IMPLANT
PENCIL SMOKE EVAC W/HOLSTER (ELECTROSURGICAL) ×3 IMPLANT
SPONGE GAUZE 4X4 12PLY (GAUZE/BANDAGES/DRESSINGS) ×3 IMPLANT
SUT MNCRL 0 VIOLET CTX 36 (SUTURE) ×2 IMPLANT
SUT MNCRL AB 3-0 PS2 27 (SUTURE) IMPLANT
SUT MON AB 2-0 CT1 27 (SUTURE) ×3 IMPLANT
SUT MON AB-0 CT1 36 (SUTURE) ×6 IMPLANT
SUT MONOCRYL 0 CTX 36 (SUTURE) ×4
SUT PLAIN 0 NONE (SUTURE) IMPLANT
SUT PLAIN 2 0 (SUTURE)
SUT PLAIN 2 0 XLH (SUTURE) ×3 IMPLANT
SUT PLAIN ABS 2-0 CT1 27XMFL (SUTURE) IMPLANT
SYR 20CC LL (SYRINGE) IMPLANT
SYR CONTROL 10ML LL (SYRINGE) ×6 IMPLANT
TOWEL OR 17X24 6PK STRL BLUE (TOWEL DISPOSABLE) ×3 IMPLANT
TRAY FOLEY CATH SILVER 14FR (SET/KITS/TRAYS/PACK) ×3 IMPLANT

## 2016-02-21 NOTE — Op Note (Signed)
Cesarean Section Procedure Note  Indications: malpresentation: footling breech  Pre-operative Diagnosis: 37 week 4 day pregnancy.  Post-operative Diagnosis: same  Surgeon: Lenoard AdenAAVON,Danaja Lasota J   Assistants: na  Anesthesia: Local anesthesia 0.25.% bupivacaine and Spinal anesthesia  ASA Class: 2  Procedure Details  The patient was seen in the Holding Room. The risks, benefits, complications, treatment options, and expected outcomes were discussed with the patient.  The patient concurred with the proposed plan, giving informed consent. The risks of anesthesia, infection, bleeding and possible injury to other organs discussed. Injury to bowel, bladder, or ureter with possible need for repair discussed. Possible need for transfusion with secondary risks of hepatitis or HIV acquisition discussed. Post operative complications to include but not limited to DVT, PE and Pneumonia noted. The site of surgery properly noted/marked. The patient was taken to Operating Room # 1, identified as Cathy Pineda and the procedure verified as C-Section Delivery. A Time Out was held and the above information confirmed.  After induction of anesthesia, the patient was draped and prepped in the usual sterile manner. A Pfannenstiel incision was made and carried down through the subcutaneous tissue to the fascia. Fascial incision was made and extended transversely using Mayo scissors. The fascia was separated from the underlying rectus tissue superiorly and inferiorly. The peritoneum was identified and entered. Peritoneal incision was extended longitudinally. The utero-vesical peritoneal reflection was incised transversely and the bladder flap was bluntly freed from the lower uterine segment. A low transverse uterine incision(Kerr hysterotomy) was made. Delivered from footling breech presentation was a  female with Apgar scores of 8 at one minute and 9 at five minutes. Bulb suctioning gently performed. Neonatal team in  attendance.After the umbilical cord was clamped and cut cord blood was obtained for evaluation. The placenta was removed intact and appeared normal. The uterus was curetted with a dry lap pack. Good hemostasis was noted.The uterine outline, tubes and ovaries appeared normal. The uterine incision was closed with running locked sutures of 0 Monocryl x 2 layers. Hemostasis was observed. .The parietal peritoneum was closed with a running 2-0 Monocryl suture. The fascia was then reapproximated with running sutures of 0 Monocryl. The skin was reapproximated with 3-0 monocryl after [place] closure with 2-0 plain.  Instrument, sponge, and needle counts were correct prior the abdominal closure and at the conclusion of the case.   Findings: Breech, footling, posterior placenta, nl tubes and ovaries  Estimated Blood Loss:  300 mL         Drains: foley                 Specimens: placenta                 Complications:  None; patient tolerated the procedure well.         Disposition: PACU - hemodynamically stable.         Condition: stable  Attending Attestation: I performed the procedure.

## 2016-02-21 NOTE — Anesthesia Preprocedure Evaluation (Signed)
Anesthesia Evaluation  Patient identified by MRN, date of birth, ID band Patient awake    Reviewed: Allergy & Precautions, H&P , Patient's Chart, lab work & pertinent test results  Airway Mallampati: II  TM Distance: >3 FB Neck ROM: full    Dental no notable dental hx.    Pulmonary    Pulmonary exam normal breath sounds clear to auscultation       Cardiovascular Exercise Tolerance: Good  Rhythm:regular Rate:Normal     Neuro/Psych    GI/Hepatic   Endo/Other  Morbid obesity  Renal/GU      Musculoskeletal   Abdominal   Peds  Hematology   Anesthesia Other Findings   Reproductive/Obstetrics                             Anesthesia Physical Anesthesia Plan  ASA: III and emergent  Anesthesia Plan: Spinal   Post-op Pain Management:    Induction:   Airway Management Planned:   Additional Equipment:   Intra-op Plan:   Post-operative Plan:   Informed Consent: I have reviewed the patients History and Physical, chart, labs and discussed the procedure including the risks, benefits and alternatives for the proposed anesthesia with the patient or authorized representative who has indicated his/her understanding and acceptance.   Dental Advisory Given  Plan Discussed with: CRNA  Anesthesia Plan Comments: (Lab work confirmed with CRNA in room. Platelets okay. Discussed spinal anesthetic, and patient consents to the procedure:  included risk of possible headache,backache, failed block, allergic reaction, and nerve injury. This patient was asked if she had any questions or concerns before the procedure started. )        Anesthesia Quick Evaluation

## 2016-02-21 NOTE — Anesthesia Procedure Notes (Signed)
Spinal  Patient location during procedure: OR Staffing Anesthesiologist: Cristela BlueJACKSON, Cathy Meding Preanesthetic Checklist Completed: patient identified, site marked, surgical consent, pre-op evaluation, timeout performed, IV checked, risks and benefits discussed and monitors and equipment checked Spinal Block Patient position: sitting Prep: DuraPrep Patient monitoring: heart rate, cardiac monitor, continuous pulse ox and blood pressure Approach: midline Location: L3-4 Injection technique: single-shot Needle Needle type: Sprotte  Needle gauge: 24 G Needle length: 9 cm Needle insertion depth: 8 cm Assessment Sensory level: T4 Additional Notes   THIS SPINAL TOOK ME 30 MINUTES TO PERFORM SHE LIKELY HAS SCOLIOSIS AND IS WAY OFF THE MIDLINE TO THE LEFT  Started as 24 Ga sprotte with only R sided ? Paresthesia?  All ? Paresthesias were atypical and perhaps just the need off midline to the right as none radiated or were localized clearly  After a while of sprotte only, and changing interspaces ( Both L45 and L34) I switched to Tuohy with sprotte thru after LOR. I due time with considerable Left sided angulation off midline I was able to get LOR and then CSF thru sprotte Spinal Dosage in OR  Bupivicaine ml       1.6 PFMS04   mcg        100 Fentanyl mcg            25

## 2016-02-21 NOTE — MAU Note (Addendum)
Pt reports ROM at 0500, contractions started at 0600

## 2016-02-21 NOTE — Lactation Note (Signed)
This note was copied from a baby's chart. Lactation Consultation Note  Patient Name: Boy Montine CircleKasey Hamre IONGE'XToday's Date: 02/21/2016 Reason for consult: Initial assessment  Initial visit at 8 hours of life.  Parents made aware that "Harrison MonsBlake" may take some time learning to breastfeed (b/c of his gestation), but we will monitor his progress. His last CBG was 55. He has been STS w/his mother.   Mom reports considerable breast changes w/pregnancy & says she has been leaking since 28-[redacted] weeks gestation.   Mom made aware of O/P services, breastfeeding support groups, community resources, and our phone # for post-discharge questions. Mom has my # to call for assist w/next feeding.  Lurline HareRichey, Terion Hedman [facility] 02/21/2016, 5:48 PM

## 2016-02-21 NOTE — H&P (Signed)
Cathy Pineda is a 25 y.o. female presenting for SROM and breech.  OB History    Gravida Para Term Preterm AB Living   1             SAB TAB Ectopic Multiple Live Births                 History reviewed. No pertinent past medical history. History reviewed. No pertinent surgical history. Family History: family history is not on file. Social History:  reports that she has never smoked. She does not have any smokeless tobacco history on file. She reports that she drinks alcohol. She reports that she does not use drugs.     Maternal Diabetes: No Genetic Screening: Normal Maternal Ultrasounds/Referrals: Normal Fetal Ultrasounds or other Referrals:  None Maternal Substance Abuse:  No Significant Maternal Medications:  None Significant Maternal Lab Results:  None Other Comments:  None  Review of Systems  Constitutional: Negative.   All other systems reviewed and are negative.  Maternal Medical History:  Reason for admission: Rupture of membranes.   Contractions: Onset was 1-2 hours ago.    Fetal activity: Perceived fetal activity is normal.   Last perceived fetal movement was within the past hour.    Prenatal Complications - Diabetes: none.    Dilation: 2.5 Effacement (%): 50 Exam by:: simpson,RN Blood pressure 117/82, pulse 92. Maternal Exam:  Uterine Assessment: Contraction strength is mild.  Contraction frequency is rare.   Abdomen: Patient reports no abdominal tenderness. Fetal presentation: breech  Introitus: Normal vulva. Normal vagina.  Ferning test: positive.  Nitrazine test: positive. Amniotic fluid character: clear.  Pelvis: adequate for delivery.   Cervix: Cervix evaluated by digital exam.     Physical Exam  Vitals reviewed. Constitutional: She appears well-developed and well-nourished.  Cardiovascular: Normal heart sounds.   Respiratory: Breath sounds normal.  GI: Soft. Normal appearance.    Prenatal labs: ABO, Rh:   Antibody:   Rubella:    RPR:    HBsAg:    HIV:    GBS: Negative (10/10 0000)   Assessment/Plan: Breech in Labor  Csection Consent done   Cathy Pineda 02/21/2016, 8:10 AM

## 2016-02-21 NOTE — Anesthesia Postprocedure Evaluation (Signed)
Anesthesia Post Note  Patient: Cathy Pineda  Procedure(s) Performed: Procedure(s) (LRB): CESAREAN SECTION (N/A)  Patient location during evaluation: PACU Anesthesia Type: Spinal Level of consciousness: awake Pain management: satisfactory to patient Vital Signs Assessment: post-procedure vital signs reviewed and stable Respiratory status: spontaneous breathing Cardiovascular status: blood pressure returned to baseline Postop Assessment: no headache and spinal receding Anesthetic complications: no Comments: Discussed post op back pain and SAB dificulty again Questinos answered     Last Vitals:  Vitals:   02/21/16 1033 02/21/16 1121  BP: 116/68   Pulse: 75   Resp: (!) 24   Temp:  36.7 C    Last Pain:  Vitals:   02/21/16 1121  TempSrc: Axillary  PainSc:    Pain Goal:                 [name] EDWARD

## 2016-02-21 NOTE — Transfer of Care (Signed)
Immediate Anesthesia Transfer of Care Note  Patient: Cathy Pineda  Procedure(s) Performed: Procedure(s): CESAREAN SECTION (N/A)  Patient Location: PACU  Anesthesia Type:Spinal  Level of Consciousness: awake, alert  and oriented  Airway & Oxygen Therapy: Patient Spontanous Breathing  Post-op Assessment: Report given to RN and Post -op Vital signs reviewed and stable  Post vital signs: Reviewed and stable  Last Vitals:  Vitals:   02/21/16 0705 02/21/16 0727  BP: (!) 98/49 117/82  Pulse: 100 92    Last Pain:  Vitals:   02/21/16 0647  PainSc: 5          Complications: No apparent anesthesia complications

## 2016-02-22 LAB — CBC
HEMATOCRIT: 28.3 % — AB (ref 36.0–46.0)
Hemoglobin: 9.7 g/dL — ABNORMAL LOW (ref 12.0–15.0)
MCH: 30.9 pg (ref 26.0–34.0)
MCHC: 34.6 g/dL (ref 30.0–36.0)
MCV: 89.3 fL (ref 78.0–100.0)
PLATELETS: 133 10*3/uL — AB (ref 150–400)
RBC: 3.17 MIL/uL — AB (ref 3.87–5.11)
RDW: 13.3 % (ref 11.5–15.5)
WBC: 9.9 10*3/uL (ref 4.0–10.5)

## 2016-02-22 LAB — RPR: RPR: NONREACTIVE

## 2016-02-22 LAB — BIRTH TISSUE RECOVERY COLLECTION (PLACENTA DONATION)

## 2016-02-22 NOTE — Progress Notes (Signed)
Pt. OOB ambulating to restroom to void with RN assist, pt. Denies dizziness/lightheadedness, voiding freely without complaint, light lochia. Ambulating well and tolerating pain well. Assisted to bedside, instructed to call for  Assist as needed, pt. Verbalized understanding.

## 2016-02-22 NOTE — Progress Notes (Signed)
Patient ID: Cathy Pineda, female   DOB: 04/21/1991, 25 y.o.   MRN: [id number] Subjective: S/P Primary Cesarean Delivery for Breech Presentation / SROM POD# 1 Information for the patient's newborn:  Cathy Pineda, Boy [name] [[id number]][[id number]]  female  / circ in progress  Reports feeling well. Feeding: breast Patient reports tolerating PO.  Breast symptoms: (+) Colostrum Pain controlled with ibuprofen (OTC) Denies HA/SOB/C/P/N/V/dizziness. Flatus present. No BM. She reports vaginal bleeding as normal, without clots.  She is ambulating, urinating without difficult.     Objective:   VS:  Vitals:   02/21/16 2100 02/21/16 2300 02/22/16 0050 02/22/16 0500  BP: (!) 113/53  (!) 106/54 (!) 109/54  Pulse: 82  82 67  Resp: 18  18 18   Temp: 98.7 F (37.1 C)  98.7 F (37.1 C) 98.3 F (36.8 C)  TempSrc: Oral  Oral Oral  SpO2: 97% 97% 95% 97%  Weight:      Height:         Intake/Output Summary (Last 24 hours) at 02/22/16 0816 Last data filed at 02/22/16 0500  Gross per 24 hour  Intake             2350 ml  Output             3225 ml  Net             -875 ml        Recent Labs  02/21/16 0700 02/22/16 0511  WBC 10.8* 9.9  HGB 12.3 9.7*  HCT 35.2* 28.3*  PLT 175 133*     Blood type: A POS (10/26 0700)     Physical Exam:   General: alert, cooperative, fatigued and no distress  CV: Regular rate and rhythm, S1S2 present or without murmur or extra heart sounds  Resp: clear  Abdomen: soft, nontender, normal bowel sounds  Incision: serous drainage present on 1/3 of HC dressing and skin well-approximaed with sutures  Uterine Fundus: firm, 1 FB below umbilicus, nontender  Lochia: minimal  Ext: extremities normal, atraumatic, no cyanosis or edema, Homans sign is negative, no sign of DVT and no edema, redness or tenderness in the calves or thighs   Assessment/Plan: 25 y.o.   POD# 1.  S/P Cesarean Delivery.  Indications: breech / SROM                Principal Problem:   Postpartum care  following cesarean delivery - BREECH (10/26) Active Problems:   Breech birth  Doing well, stable.               Regular diet as tolerated Ambulate Routine post-op care Change dressing  Kenard GowerAWSON, Keirah Konitzer, M, MSN, CNM 02/22/2016, 8:16 AM

## 2016-02-22 NOTE — Progress Notes (Addendum)
Honeycomb drresing changed per order.  1700: pt desires bottle feeding. Discussed risks of bottle feeding with pt understanding.

## 2016-02-22 NOTE — Lactation Note (Signed)
This note was copied from a baby's chart. Lactation Consultation Note  Patient Name: Cathy Pineda CircleKasey Ackley HQION'GToday's Date: 02/22/2016 Reason for consult: Follow-up assessment;Other (Comment) (ET ., post circ )  Baby is 729 hours old, and per parents hasn't fed well today after circ. LC stripped baby down STS,  Placed in football position right breast, and after several attempts latched with tea cup hold and obtained the  Depth for 6 minutes with multiple swallows, increased with breast compressions 8-10 swallows noted and baby  Released after 6 mins of sustaining latch, relaxed, and sleepy. LC recommended STS.  MBU RN entered  And  moved mom to the bed, STS , for PKU. Baby slept through it.  In the mean time LC sized mom for #20 and #24 NS. Nipples / areolas borderline fit for NS fitting properly.  Both sizes fit , #24 NS seems to accommodate the areola more than the #20 NS.  After PKU ,mom desired to move back to couch/ baby woke up / LC applied the #24 NS, and latched the baby  Easily, depth could be better , and upper lip had a difficult time staying flanged even with shifting hips back towards the back of couch. Baby released small amount if colostrum noted in the NS, mom swabbed it on baby's lips. LC applied the #20 NS , seemed to fit better,  And baby to sleepy to latch. Mom and dad aware to call for reassessment for latch with NS.  LC had explained to mom and dad if the NS worked for latching , and mom expressed milk , with a curved tip syringe EBM could be instilled  Into the top for an appetizer. LC earlier had set up her DEBP , and mom aware post pumping is indicated for 10 -15 mins. Due to mom STS with  Baby did not post pump . Mom had requested the use of her pump. 3-11 p LC aware to see this mom.    Maternal Data Has patient been taught Hand Expression?: Yes  Feeding Feeding Type: Breast Fed Length of feed: 2 min  LATCH Score/Interventions Latch: Grasps breast easily, tongue  down, lips flanged, rhythmical sucking. Intervention(s): Skin to skin;Teach feeding cues;Waking techniques Intervention(s): Adjust position;Assist with latch;Breast massage;Breast compression  Audible Swallowing: A few with stimulation (scant amount clostrum noted in the NS after baby released )  Type of Nipple: Everted at rest and after stimulation (with  24 NS )  Comfort (Breast/Nipple): Soft / non-tender     Hold (Positioning): Assistance needed to correctly position infant at breast and maintain latch. Intervention(s): Breastfeeding basics reviewed;Support Pillows;Position options;Skin to skin  LATCH Score: 8  Lactation Tools Discussed/Used Tools: Nipple Shields Nipple shield size: 20;24;Other (comment) (sized for both , latched with #24 NS, unable to get good depth , switched to #20 NS, baby to sleepy to latch , will need reasessment ) Shell Type: Inverted Breast pump type: Other (comment) (LC requested to use her own DEBP- Medela, LC set it up , see LC note ) WIC Program: No   Consult Status Consult Status: Follow-up ([facility], see [facility] note ) Date: 02/22/16 Follow-up type: In-patient    Matilde SprangMargaret Ann Geraldin Habermehl 02/22/2016, 3:43 PM

## 2016-02-22 NOTE — Lactation Note (Signed)
This note was copied from a baby's chart. Lactation Consultation Note Mom having difficulty latching at times. Mom has flat nipples that evert w/stimulation, then returns to flat w/o constant stimulation.  Mom has pendulum breast w/colostrum noted w/hand expression. Gave mom bullet to collect colostrum in. Baby wanting to BF, cueing and crying, but will not suck. Nipple sand whiched in mouth, wouldn't suck. After multiple tries, repositioning, stopping, STS, resting, baby finally in football hold to Lt. Breast suckled approx. 1 min. Heard swallows during that time.  Baby has recessed chin, upper lip labial frenulum tight. Mom getting frustrated. Encouraged to take a break rest do STS.  Shells given for mom to wear today in bra. Encouraged to use hand pump prior to latching to evert nipple. At times, mom kept pulling breast tight which made nipple not compressible and flat. Baby unable to latch. Discussed hold of breast for latching. Mom very tired, hasn't slept in 24 hrs per mom.  Baby had 6 voids and 5 stools since birth. Educated on newborn behavior and feeding habits, STS, cluster feeding, supply and demand. Reported to RN of consult.  Patient Name: Boy Montine CircleKasey Shipper ZOXWR'UToday's Date: 02/22/2016 Reason for consult: Follow-up assessment;Difficult latch   Maternal Data    Feeding Feeding Type: Breast Fed Length of feed: 1 min  LATCH Score/Interventions Latch: Too sleepy or reluctant, no latch achieved, no sucking elicited. Intervention(s): Skin to skin;Teach feeding cues;Waking techniques  Audible Swallowing: A few with stimulation Intervention(s): Skin to skin  Type of Nipple: Flat Intervention(s): Hand pump;Shells  Comfort (Breast/Nipple): Soft / non-tender     Hold (Positioning): Full assist, staff holds infant at breast Intervention(s): Breastfeeding basics reviewed;Support Pillows;Position options;Skin to skin  LATCH Score: 4  Lactation Tools Discussed/Used Tools:  Shells;Pump Shell Type: Inverted Breast pump type: Manual Pump Review: Setup, frequency, and cleaning;Milk Storage   Consult Status Consult Status: Follow-up Date: 02/22/16 Follow-up type: In-patient    Parys Elenbaas, Diamond NickelLAURA G 02/22/2016, 3:21 AM

## 2016-02-23 NOTE — Lactation Note (Signed)
Baby. Mom having trouble applying niplle shield. 24 is alLactation Consultation Note  Patient Name: Montine CircleKasey Detty HYIFO'YToday's Date: 02/23/2016 Reason for consult: Follow-up assessmentwith this mom and term baby. Mom having difficulty applying nipple shiled. I reviewed with mom how to apply, and will have her demonstrate again later today. We used EBM and then formula in the shield, to get baby sucking. Breast movement was seen once baby got into a good rhythym. I will make an o/p appointment for mom and baby prior to discharge tomorrow. Mom will call for lactation as needed.    Maternal Data    Feeding Feeding Type: Breast Fed  LATCH Score/Interventions Latch: Repeated attempts needed to sustain latch, nipple held in mouth throughout feeding, stimulation needed to elicit sucking reflex. Intervention(s): Adjust position;Assist with latch;Breast compression  Audible Swallowing: A few with stimulation Intervention(s): Skin to skin;Hand expression  Type of Nipple: Flat Intervention(s): Double electric pump  Comfort (Breast/Nipple): Filling, red/small blisters or bruises, mild/mod discomfort  Problem noted: Filling;Mild/Moderate discomfort  Hold (Positioning): Assistance needed to correctly position infant at breast and maintain latch. Intervention(s): Breastfeeding basics reviewed;Support Pillows;Position options;Skin to skin  LATCH Score: 5  Lactation Tools Discussed/Used Tools: Nipple Shields Nipple shield size: 24   Consult Status Consult Status: Follow-up Date: 02/24/16 Follow-up type: In-patient    Alfred LevinsLee, Sherece Gambrill Anne 02/23/2016, 12:46 PM

## 2016-02-23 NOTE — Lactation Note (Signed)
This note was copied from a baby's chart. Lactation Consultation Note  Patient Name: Cathy Pineda: 02/23/2016 Reason for consult: Follow-up assessment   With this mom of an early term baby, now 3848 hours old, and 37 6/7 weeks CGa. The baby is 8 1/2 pounds. He likes to suck his tongue, has a somewhat recessed chin, tight mouth, limited mobility of his tongue, with a short posterior frenulum, imbedded in thick tissue. He has cobblestoning of his lips, from trying to hold onto mom's breast with his lips, due to decreased tongue mobility . I tried latching baby with mom using a 20 nipple shield, but this was very painful for mom. We then tried a 24 nipple shield, filled with alimentum, and it took a minute or so of waiting to coax his mouth open, and with a teacup hold on both sides of mom's areola and nipple shield, we were able to advance him deeply on mom's breast. She had good breast movement, and no pain.  Mom needs to practice proper application of nipple shield, which I will review with her prior to discharge today. I also advised mom to allow baby to take up to 30 ml's of formula supplement now that he is in his third day of life.  Mom has not had time to pump, but does have a DEP for home use. The importance of pumping to protect her milk, and provide EBm for her baby was reviewed.  I offerred for parents to supplement baby at the breast, but mom prefers using a bottle.  I will see if mom agrees to me making an o/p lactation consult for her and baby, prior to discharge today.   Maternal Data    Feeding Feeding Type: Formula  LATCH Score/Interventions Latch: Repeated attempts needed to sustain latch, nipple held in mouth throughout feeding, stimulation needed to elicit sucking reflex. Intervention(s): Skin to skin;Teach feeding cues;Waking techniques Intervention(s): Adjust position;Assist with latch;Breast compression (tea cup hold by mom and LC, on areola and nipple  shiled, to advance baby deep on breast)  Audible Swallowing: A few with stimulation Intervention(s): Skin to skin;Hand expression  Type of Nipple: Flat  Comfort (Breast/Nipple): Filling, red/small blisters or bruises, mild/mod discomfort  Problem noted: Mild/Moderate discomfort Interventions (Mild/moderate discomfort): Hand expression;Post-pump  Hold (Positioning): Assistance needed to correctly position infant at breast and maintain latch. Intervention(s): Breastfeeding basics reviewed;Support Pillows;Position options;Skin to skin  LATCH Score: 5  Lactation Tools Discussed/Used Tools: Nipple Shields Nipple shield size: 24   Consult Status Consult Status: Follow-up Pineda: 02/23/16 Follow-up type: In-patient    Alfred LevinsLee, Sanaiyah Kirchhoff Anne 02/23/2016, 9:57 AM

## 2016-02-23 NOTE — Progress Notes (Signed)
Patient ID: Cathy Pineda, female   DOB: 10/05/1990, 25 y.o.   MRN: [id number] Subjective: S/P Primary Cesarean Delivery for Breech Presentation / SROM POD# 2 Information for the patient's newborn:  Cathy Pineda, Cathy Pineda [[id number]][[id number]]  female  / circ done  Reports feeling well. Feeding: breast Patient reports tolerating PO.  Breast symptoms: (+) Colostrum, difficulty with nursing d/t tight mouth and tongue of infant Pain controlled with ibuprofen (OTC) Denies HA/SOB/C/P/N/V/dizziness. Flatus present. (+) BM. She reports vaginal bleeding as normal, without clots.  She is ambulating, urinating without difficult.     Objective:   VS:  Vitals:   02/22/16 0500 02/22/16 0830 02/22/16 1845 02/23/16 0700  BP: (!) 109/54 (!) 87/54 (!) 102/55 (!) 98/59  Pulse: 67 88 96 83  Resp: 18 16 18 18   Temp: 98.3 F (36.8 C) 97.9 F (36.6 C) 98.3 F (36.8 C) 97.6 F (36.4 C)  TempSrc: Oral Oral Oral Oral  SpO2: 97% 97%    Weight:      Height:        No intake or output data in the 24 hours ending 02/23/16 0853       Recent Labs  02/21/16 0700 02/22/16 0511  WBC 10.8* 9.9  HGB 12.3 9.7*  HCT 35.2* 28.3*  PLT 175 133*     Blood type: A POS (10/26 0700)     Physical Exam:   General: alert, cooperative, fatigued and no distress  CV: Regular rate and rhythm, S1S2 present or without murmur or extra heart sounds  Resp: clear  Abdomen: soft, nontender, normal bowel sounds  Incision: Dressing C/D/I, skin well-approximaed with sutures  Uterine Fundus: firm, 1 FB below umbilicus, nontender  Lochia: minimal  Ext: extremities normal, atraumatic, no cyanosis or edema, Homans sign is negative, no sign of DVT and no edema, redness or tenderness in the calves or thighs   Assessment/Plan: 25 y.o.   POD# 2.  S/P Cesarean Delivery.  Indications: breech / SROM                Principal Problem:   Postpartum care following cesarean delivery - BREECH (10/26) Active Problems:   Breech birth  Doing well,  stable.               Regular diet as tolerated Ambulate Routine post-op care [facility] Anticipate d/c home tomorrow   Raelyn MoraAWSON, Rashiya Lofland, Judie PetitM, MSN, CNM 02/23/2016, 8:53 AM

## 2016-02-24 MED ORDER — IBUPROFEN 600 MG PO TABS
600.0000 mg | ORAL_TABLET | Freq: Four times a day (QID) | ORAL | 0 refills | Status: DC
Start: 2016-02-24 — End: 2021-02-16

## 2016-02-24 MED ORDER — OXYCODONE-ACETAMINOPHEN 5-325 MG PO TABS
2.0000 | ORAL_TABLET | ORAL | 0 refills | Status: DC | PRN
Start: 2016-02-24 — End: 2021-02-16

## 2016-02-24 NOTE — Discharge Summary (Signed)
OB Discharge Summary     Patient Name: Cathy Pineda DOB: 03/03/1991 MRN: [id number]  Date of admission: 02/21/2016 Delivering MD: Olivia MackieAAVON, RICHARD   Date of discharge: 02/24/2016  Admitting diagnosis: Breech / SROM Intrauterine pregnancy: [id number]     Secondary diagnosis:  Principal Problem:   Postpartum care following cesarean delivery - BREECH (10/26) Active Problems:   Breech birth  Additional problems: none     Discharge diagnosis: Term Pregnancy Delivered                                                                                                Post partum procedures:none  Augmentation: none  Complications: None  Hospital course:  Onset of Labor With Unplanned C/S  25 y.o. yo G1P1001 at 5152w4d was admitted in Latent Labor on 02/21/2016. Patient had a labor course significant for dilation to 2.5cm/50%; baby found to be BREECH. Membrane Rupture Time/Date: 5:00 AM ,02/21/2016   The patient went for cesarean section due to Malpresentation and SROM, and delivered a Viable infant,02/21/2016  Details of operation can be found in separate operative note. Patient had an uncomplicated postpartum course.  She is ambulating,tolerating a regular diet, passing flatus, and urinating well.  Patient is discharged home in stable condition 02/24/16.   Physical exam Vitals:   02/22/16 1845 02/23/16 0700 02/23/16 1842 02/24/16 0456  BP: (!) 102/55 (!) 98/59 (!) 118/54 (!) 99/45  Pulse: 96 83 84 79  Resp: 18 18 16 18   Temp: 98.3 F (36.8 C) 97.6 F (36.4 C) 98.6 F (37 C) 97.8 F (36.6 C)  TempSrc: Oral Oral Oral Oral  SpO2:   99%   Weight:      Height:       General: alert, cooperative and no distress Lochia: appropriate Uterine Fundus: firm, midline, U-3 Incision: Healing well with no significant drainage, No significant erythema, Dressing is clean, dry, and intact, skin well-approximated with sutures DVT Evaluation: No evidence of DVT seen on physical exam. Negative Homan's  sign. No cords or calf tenderness. No significant calf/ankle edema. Labs: Lab Results  Component Value Date   WBC 9.9 02/22/2016   HGB 9.7 (L) 02/22/2016   HCT 28.3 (L) 02/22/2016   MCV 89.3 02/22/2016   PLT 133 (L) 02/22/2016   CMP Latest Ref Rng & Units 09/27/2014  Glucose 65 - 99 mg/dL 95  BUN 6 - 20 mg/dL 6  Creatinine 8.290.44 - 5.621.00 mg/dL 1.300.78  Sodium 865135 - 784145 mmol/L 138  Potassium 3.5 - 5.1 mmol/L 4.1  Chloride 101 - 111 mmol/L 102  CO2 22 - 32 mmol/L 27  Calcium 8.9 - 10.3 mg/dL 9.3  Total Protein 6.5 - 8.1 g/dL 7.0  Total Bilirubin 0.3 - 1.2 mg/dL 0.6  Alkaline Phos 38 - 126 U/L 61  AST 15 - 41 U/L 23  ALT 14 - 54 U/L 28    Discharge instruction: per After Visit Summary and "Baby and Me Booklet".  After visit meds:    Medication List    TAKE these medications   acetaminophen 500 MG tablet Commonly known as:  TYLENOL Take  500 mg by mouth every 6 (six) hours as needed for mild pain, moderate pain or headache.   ibuprofen 600 MG tablet Commonly known as:  ADVIL,MOTRIN Take 1 tablet (600 mg total) by mouth every 6 (six) hours.   oxyCODONE-acetaminophen 5-325 MG tablet Commonly known as:  PERCOCET/ROXICET Take 2 tablets by mouth every 4 (four) hours as needed (pain scale > 7).   prenatal multivitamin Tabs tablet Take 1 tablet by mouth at bedtime.       Diet: routine diet  Activity: Advance as tolerated. Pelvic rest for 6 weeks.   Outpatient follow up:6 weeks Follow up Appt:No future appointments. Follow up Visit:No Follow-up on file.  Postpartum contraception: Undecided  Newborn Data: Live born female on 02/21/2016 Birth Weight: 8 lb 15.6 oz (4070 g) APGAR: 9, 10  Baby Feeding: Breast and SNS System with formula Disposition:home with mother   02/24/2016 Raelyn MoraAWSON, Natasja Niday, Judie PetitM, CNM

## 2016-02-24 NOTE — Lactation Note (Signed)
This note was copied from a baby's chart. Lactation Consultation Note  Patient Name: Cathy Pineda CircleKasey Rozas RUEAV'WToday's Date: 02/24/2016 Reason for consult: Follow-up assessment   With this mom of an early term baby, now 38 weeks CGA, and 72 hours old. Mom is doing better at correctly applying NS, and dad helping. Mom wanted the baby to latch wiohout the shield, and I let her try, but the baby was not able to. I explained that the shield not only protect her nipple, but help the baby to stay latched, and get deeper on the breast. He was sleepy at this time, so mom agreed to trying  a single SNS under the shield, with formula. He had some good bursts of sucking, and transferred 10 ml's of formula. Mom is going to call for at least 1 more feed, before going home, to practice the use all of these tools. I also had mom pump in maintenance setting, to see how much she is producing. She said she can feel her breasts filling, but they are still very soft to me. Mom is also supplementing by bottle  With Alimentum, and an o/p lactation appointment was made for 11/13   Maternal Data    Feeding Feeding Type: Formula Length of feed: 20 min (on and off sucking, sleepy, required stimulation to suckle)  LATCH Score/Interventions Latch: Repeated attempts needed to sustain latch, nipple held in mouth throughout feeding, stimulation needed to elicit sucking reflex. Intervention(s): Skin to skin;Teach feeding cues;Waking techniques Intervention(s): Adjust position;Assist with latch;Breast massage;Breast compression  Audible Swallowing: A few with stimulation  Type of Nipple: Flat Intervention(s): Double electric pump  Comfort (Breast/Nipple): Filling, red/small blisters or bruises, mild/mod discomfort  Problem noted: Filling;Mild/Moderate discomfort Interventions (Mild/moderate discomfort): Post-pump  Hold (Positioning): Assistance needed to correctly position infant at breast and maintain  latch. Intervention(s): Breastfeeding basics reviewed;Support Pillows;Position options;Skin to skin  LATCH Score: 5  Lactation Tools Discussed/Used Tools: Nipple Dorris CarnesShields;Supplemental Nutrition System Nipple shield size: 24 Breast pump type: Double-Electric Breast Pump   Consult Status Consult Status: Follow-up Date: 02/24/16 Follow-up type: In-patient    Alfred LevinsLee, Gurneet Matarese Anne 02/24/2016, 12:16 PM

## 2016-02-24 NOTE — Discharge Instructions (Signed)
Breast Pumping Tips °If you are breastfeeding, there may be times when you cannot feed your baby directly. Returning to work or going on a trip are common examples. Pumping allows you to store breast milk and feed it to your baby later.  °You may not get much milk when you first start to pump. Your breasts should start to make more after a few days. If you pump at the times you usually feed your baby, you may be able to keep making enough milk to feed your baby without also using formula. The more often you pump, the more milk you will produce.  °WHEN SHOULD I PUMP?  °· You can begin to pump soon after delivery. However, some experts recommend waiting about 4 weeks before giving your infant a bottle to make sure breastfeeding is going well.  °· If you plan to return to work, begin pumping a few weeks before. This will help you develop techniques that work best for you. It also lets you build up a supply of breast milk.   °· When you are with your infant, feed on demand and pump after each feeding.   °· When you are away from your infant for several hours, pump for about 15 minutes every 2-3 hours. Pump both breasts at the same time if you can.   °· If your infant has a formula feeding, make sure to pump around the same time.     °· If you drink any alcohol, wait 2 hours before pumping.   °HOW DO I PREPARE TO PUMP? °Your let-down reflex is the natural reaction to stimulation that makes your breast milk flow. It is easier to stimulate this reflex when you are relaxed. Find relaxation techniques that work for you. If you have difficulty with your let-down reflex, try these methods:  °· Smell one of your infant's blankets or an item of clothing.   °· Look at a picture or video of your infant.   °· Sit in a quiet, private space.   °· Massage the breast you plan to pump.   °· Place soothing warmth on the breast.   °· Play relaxing music.   °WHAT ARE SOME GENERAL BREAST PUMPING TIPS? °· Wash your hands before you pump. You  do not need to wash your nipples or breasts. °· There are three ways to pump. °· You can use your hand to massage and compress your breast. °· You can use a handheld manual pump. °· You can use an electric pump.   °· Make sure the suction cup (flange) on the breast pump is the right size. Place the flange directly over the nipple. If it is the wrong size or placed the wrong way, it may be painful and cause nipple damage.   °· If pumping is uncomfortable, apply a small amount of purified or modified lanolin to your nipple and areola. °· If you are using an electric pump, adjust the speed and suction power to be more comfortable. °· If pumping is painful or if you are not getting very much milk, you may need a different type of pump. A lactation consultant can help you determine what type of pump to use.   °· Keep a full water bottle near you at all times. Drinking lots of fluid helps you make more milk.  °· You can store your milk to use later. Pumped breast milk can be stored in a sealable, sterile container or plastic bag. Label all stored breast milk with the date you pumped it. °· Milk can stay out at room temperature for up to 8 hours. °·   You can store your milk in the refrigerator for up to 8 days. °· You can store your milk in the freezer for 3 months. Thaw frozen milk using warm water. Do not put it in the microwave. °· Do not smoke. Smoking can lower your milk supply and harm your infant. If you need help quitting, ask your health care provider to recommend a program.   °WHEN SHOULD I CALL MY HEALTH CARE PROVIDER OR A LACTATION CONSULTANT? °· You are having trouble pumping. °· You are concerned that you are not making enough milk. °· You have nipple pain, soreness, or redness. °· You want to use birth control. Birth control pills may lower your milk supply. Talk to your health care provider about your options. °  °This information is not intended to replace advice given to you by your health care provider.  Make sure you discuss any questions you have with your health care provider. °  °Document Released: 10/02/2009 Document Revised: 04/19/2013 Document Reviewed: 02/04/2013 °[employer]vier Interactive Patient Education ©2016 [employer] °Postpartum Depression and Baby Blues °The postpartum period begins right after the birth of a baby. During this time, there is often a great amount of joy and excitement. It is also a time of many changes in the life of the parents. Regardless of how many times a mother gives birth, each child brings new challenges and dynamics to the family. It is not unusual to have feelings of excitement along with confusing shifts in moods, emotions, and thoughts. All mothers are at risk of developing postpartum depression or the "baby blues." These mood changes can occur right after giving birth, or they may occur many months after giving birth. The baby blues or postpartum depression can be mild or severe. Additionally, postpartum depression can go away rather quickly, or it can be a long-term condition.  °CAUSES °Raised hormone levels and the rapid drop in those levels are thought to be a main cause of postpartum depression and the baby blues. A number of hormones change during and after pregnancy. Estrogen and progesterone usually decrease right after the delivery of your baby. The levels of thyroid hormone and various cortisol steroids also rapidly drop. Other factors that play a role in these mood changes include major life events and genetics.  °RISK FACTORS °If you have any of the following risks for the baby blues or postpartum depression, know what symptoms to watch out for during the postpartum period. Risk factors that may increase the likelihood of getting the baby blues or postpartum depression include: °· Having a personal or family history of depression.   °· Having depression while being pregnant.   °· Having premenstrual mood issues or mood issues related to oral  contraceptives. °· Having a lot of life stress.   °· Having marital conflict.   °· Lacking a social support network.   °· Having a baby with special needs.   °· Having health problems, such as diabetes.   °SIGNS AND SYMPTOMS °Symptoms of baby blues include: °· Brief changes in mood, such as going from extreme happiness to sadness. °· Decreased concentration.   °· Difficulty sleeping.   °· Crying spells, tearfulness.   °· Irritability.   °· Anxiety.   °Symptoms of postpartum depression typically begin within the first month after giving birth. These symptoms include: °· Difficulty sleeping or excessive sleepiness.   °· Marked weight loss.   °· Agitation.   °· Feelings of worthlessness.   °· Lack of interest in activity or food.   °Postpartum psychosis is a very serious condition and can be dangerous. Fortunately, it is   rare. Displaying any of the following symptoms is cause for immediate medical attention. Symptoms of postpartum psychosis include:  °· Hallucinations and delusions.   °· Bizarre or disorganized behavior.   °· Confusion or disorientation.   °DIAGNOSIS  °A diagnosis is made by an evaluation of your symptoms. There are no medical or lab tests that lead to a diagnosis, but there are various questionnaires that a health care provider may use to identify those with the baby blues, postpartum depression, or psychosis. Often, a screening tool called the Edinburgh Postnatal Depression Scale is used to diagnose depression in the postpartum period.  °TREATMENT °The baby blues usually goes away on its own in 1-2 weeks. Social support is often all that is needed. You will be encouraged to get adequate sleep and rest. Occasionally, you may be given medicines to help you sleep.  °Postpartum depression requires treatment because it can last several months or longer if it is not treated. Treatment may include individual or group therapy, medicine, or both to address any social, physiological, and psychological factors  that may play a role in the depression. Regular exercise, a healthy diet, rest, and social support may also be strongly recommended.  °Postpartum psychosis is more serious and needs treatment right away. Hospitalization is often needed. °HOME CARE INSTRUCTIONS °· Get as much rest as you can. Nap when the baby sleeps.   °· Exercise regularly. Some women find yoga and walking to be beneficial.   °· Eat a balanced and nourishing diet.   °· Do little things that you enjoy. Have a cup of tea, take a bubble bath, read your favorite magazine, or listen to your favorite music. °· Avoid alcohol.   °· Ask for help with household chores, cooking, grocery shopping, or running errands as needed. Do not try to do everything.   °· Talk to people close to you about how you are feeling. Get support from your partner, family members, friends, or other new moms. °· Try to stay positive in how you think. Think about the things you are grateful for.   °· Do not spend a lot of time alone.   °· Only take over-the-counter or prescription medicine as directed by your health care provider. °· Keep all your postpartum appointments.   °· Let your health care provider know if you have any concerns.   °SEEK MEDICAL CARE IF: °You are having a reaction to or problems with your medicine. °SEEK IMMEDIATE MEDICAL CARE IF: °· You have suicidal feelings.   °· You think you may harm the baby or someone else. °MAKE SURE YOU: °· Understand these instructions. °· Will watch your condition. °· Will get help right away if you are not doing well or get worse. °  °This information is not intended to replace advice given to you by your health care provider. Make sure you discuss any questions you have with your health care provider. °  °Document Released: 01/17/2004 Document Revised: 04/19/2013 Document Reviewed: 01/24/2013 °Elsevier Interactive Patient Education ©2016 [employer] °Postpartum Care After Cesarean Delivery °After you deliver your newborn  (postpartum period), the usual stay in the hospital is 24-72 hours. If there were problems with your labor or delivery, or if you have other medical problems, you might be in the hospital longer.  °While you are in the hospital, you will receive help and instructions on how to care for yourself and your newborn during the postpartum period.  °While you are in the hospital: °· It is normal for you to have pain or discomfort from the incision in your   abdomen. Be sure to tell your nurses when you are having pain, where the pain is located, and what makes the pain worse. °· If you are breastfeeding, you may feel uncomfortable contractions of your uterus for a couple of weeks. This is normal. The contractions help your uterus get back to normal size. °· It is normal to have some bleeding after delivery. °· For the first 1-3 days after delivery, the flow is red and the amount may be similar to a period. °· It is common for the flow to start and stop. °· In the first few days, you may pass some small clots. Let your nurses know if you begin to pass large clots or your flow increases. °· Do not  flush blood clots down the toilet before having the nurse look at them. °· During the next 3-10 days after delivery, your flow should become more watery and pink or brown-tinged in color. °· Ten to fourteen days after delivery, your flow should be a small amount of yellowish-white discharge. °· The amount of your flow will decrease over the first few weeks after delivery. Your flow may stop in 6-8 weeks. Most women have had their flow stop by 12 weeks after delivery. °· You should change your sanitary pads frequently. °· Wash your hands thoroughly with soap and water for at least 20 seconds after changing pads, using the toilet, or before holding or feeding your newborn. °· Your intravenous (IV) tubing will be removed when you are drinking enough fluids. °· The urine drainage tube (urinary catheter) that was inserted before delivery  may be removed within 6-8 hours after delivery or when feeling returns to your legs. You should feel like you need to empty your bladder within the first 6-8 hours after the catheter has been removed. °· In case you become weak, lightheaded, or faint, call your nurse before you get out of bed for the first time and before you take a shower for the first time. °· Within the first few days after delivery, your breasts may begin to feel tender and full. This is called engorgement. Breast tenderness usually goes away within 48-72 hours after engorgement occurs. You may also notice milk leaking from your breasts. If you are not breastfeeding, do not stimulate your breasts. Breast stimulation can make your breasts produce more milk. °· Spending as much time as possible with your newborn is very important. During this time, you and your newborn can feel close and get to know each other. Having your newborn stay in your room (rooming in) will help to strengthen the bond with your newborn. It will give you time to get to know your newborn and become comfortable caring for your newborn. °· Your hormones change after delivery. Sometimes the hormone changes can temporarily cause you to feel sad or tearful. These feelings should not last more than a few days. If these feelings last longer than that, you should talk to your caregiver. °· If desired, talk to your caregiver about methods of family planning or contraception. °· Talk to your caregiver about immunizations. Your caregiver may want you to have the following immunizations before leaving the hospital: °· Tetanus, diphtheria, and pertussis (Tdap) or tetanus and diphtheria (Td) immunization. It is very important that you and your family (including grandparents) or others caring for your newborn are up-to-date with the Tdap or Td immunizations. The Tdap or Td immunization can help protect your newborn from getting ill. °· Rubella immunization. °· Varicella (chickenpox)    immunization. °· Influenza immunization. You should receive this annual immunization if you did not receive the immunization during your pregnancy. °  °This information is not intended to replace advice given to you by your health care provider. Make sure you discuss any questions you have with your health care provider. °  °Document Released: 01/07/2012 Document Reviewed: 01/07/2012 °[employer]vier Interactive Patient Education ©2016 [employer] °Breastfeeding and Mastitis °Mastitis is inflammation of the breast tissue. It can occur in women who are breastfeeding. This can make breastfeeding painful. Mastitis will sometimes go away on its own. Your health care provider will help determine if treatment is needed. °CAUSES °Mastitis is often associated with a blocked milk (lactiferous) duct. This can happen when too much milk builds up in the breast. Causes of excess milk in the breast can include: °· Poor latch-on. If your baby is not latched onto the breast properly, she or he may not empty your breast completely while breastfeeding. °· Allowing too much time to pass between feedings. °· Wearing a bra or other clothing that is too tight. This puts extra pressure on the lactiferous ducts so milk does not flow through them as it should. °Mastitis can also be caused by a bacterial infection. Bacteria may enter the breast tissue through cuts or openings in the skin. In women who are breastfeeding, this may occur because of cracked or irritated skin. Cracks in the skin are often caused when your baby does not latch on properly to the breast. °SIGNS AND SYMPTOMS °· Swelling, redness, tenderness, and pain in an area of the breast. °· Swelling of the glands under the arm on the same side. °· Fever may or may not accompany mastitis. °If an infection is allowed to progress, a collection of pus (abscess) may develop. °DIAGNOSIS  °Your health care provider can usually diagnose mastitis based on your symptoms and a physical exam.  Tests may be done to help confirm the diagnosis. These may include: °· Removal of pus from the breast by applying pressure to the area. This pus can be examined in the lab to determine which bacteria are present. If an abscess has developed, the fluid in the abscess can be removed with a needle. This can also be used to confirm the diagnosis and determine the bacteria present. In most cases, pus will not be present. °· Blood tests to determine if your body is fighting a bacterial infection. °· Mammogram or ultrasound tests to rule out other problems or diseases. °TREATMENT  °Mastitis that occurs with breastfeeding will sometimes go away on its own. Your health care provider may choose to wait 24 hours after first seeing you to decide whether a prescription medicine is needed. If your symptoms are worse after 24 hours, your health care provider will likely prescribe an antibiotic medicine to treat the mastitis. He or she will determine which bacteria are most likely causing the infection and will then select an appropriate antibiotic medicine. This is sometimes changed based on the results of tests performed to identify the bacteria, or if there is no response to the antibiotic medicine selected. Antibiotic medicines are usually given by mouth. You may also be given medicine for pain. °HOME CARE INSTRUCTIONS °· Only take over-the-counter or prescription medicines for pain, fever, or discomfort as directed by your health care provider. °· If your health care provider prescribed an antibiotic medicine, take the medicine as directed. Make sure you finish it even if you start to feel better. °· Do not wear a   tight or underwire bra. Wear a soft, supportive bra. °· Increase your fluid intake, especially if you have a fever. °· Continue to empty the breast. Your health care provider can tell you whether this milk is safe for your infant or needs to be thrown out. You may be told to stop nursing until your health care  provider thinks it is safe for your baby. Use a breast pump if you are advised to stop nursing. °· Keep your nipples clean and dry. °· Empty the first breast completely before going to the other breast. If your baby is not emptying your breasts completely for some reason, use a breast pump to empty your breasts. °· If you go back to work, pump your breasts while at work to stay in time with your nursing schedule. °· Avoid allowing your breasts to become overly filled with milk (engorged). °SEEK MEDICAL CARE IF: °· You have pus-like discharge from the breast. °· Your symptoms do not improve with the treatment prescribed by your health care provider within 2 days. °SEEK IMMEDIATE MEDICAL CARE IF: °· Your pain and swelling are getting worse. °· You have pain that is not controlled with medicine. °· You have a red line extending from the breast toward your armpit. °· You have a fever or persistent symptoms for more than 2-3 days. °· You have a fever and your symptoms suddenly get worse. °MAKE SURE YOU:  °· Understand these instructions. °· Will watch your condition. °· Will get help right away if you are not doing well or get worse. °  °This information is not intended to replace advice given to you by your health care provider. Make sure you discuss any questions you have with your health care provider. °  °Document Released: 08/09/2004 Document Revised: 04/19/2013 Document Reviewed: 11/18/2012 °[employer]vier Interactive Patient Education ©2016 [employer] °Breastfeeding °Deciding to breastfeed is one of the best choices you can make for you and your baby. A change in hormones during pregnancy causes your breast tissue to grow and increases the number and size of your milk ducts. These hormones also allow proteins, sugars, and fats from your blood supply to make breast milk in your milk-producing glands. Hormones prevent breast milk from being released before your baby is born as well as prompt milk flow after birth. Once  breastfeeding has begun, thoughts of your baby, as well as his or her sucking or crying, can stimulate the release of milk from your milk-producing glands.  °BENEFITS OF BREASTFEEDING °For Your Baby °· Your first milk (colostrum) helps your baby's digestive system function better. °· There are antibodies in your milk that help your baby fight off infections. °· Your baby has a lower incidence of asthma, allergies, and sudden infant death syndrome. °· The nutrients in breast milk are better for your baby than infant formulas and are designed uniquely for your baby's needs. °· Breast milk improves your baby's brain development. °· Your baby is less likely to develop other conditions, such as childhood obesity, asthma, or type 2 diabetes mellitus. °For You °· Breastfeeding helps to create a very special bond between you and your baby. °· Breastfeeding is convenient. Breast milk is always available at the correct temperature and costs nothing. °· Breastfeeding helps to burn calories and helps you lose the weight gained during pregnancy. °· Breastfeeding makes your uterus contract to its prepregnancy size faster and slows bleeding (lochia) after you give birth.   °· Breastfeeding helps to lower your risk of developing type   2 diabetes mellitus, osteoporosis, and breast or ovarian cancer later in life. °SIGNS THAT YOUR BABY IS HUNGRY °Early Signs of Hunger °· Increased alertness or activity. °· Stretching. °· Movement of the head from side to side. °· Movement of the head and opening of the mouth when the corner of the mouth or cheek is stroked (rooting). °· Increased sucking sounds, smacking lips, cooing, sighing, or squeaking. °· Hand-to-mouth movements. °· Increased sucking of fingers or hands. °Late Signs of Hunger °· Fussing. °· Intermittent crying. °Extreme Signs of Hunger °Signs of extreme hunger will require calming and consoling before your baby will be able to breastfeed successfully. Do not wait for the  following signs of extreme hunger to occur before you initiate breastfeeding: °· Restlessness. °· A loud, strong cry. °· Screaming. °BREASTFEEDING BASICS °Breastfeeding Initiation °· Find a comfortable place to sit or lie down, with your neck and back well supported. °· Place a pillow or rolled up blanket under your baby to bring him or her to the level of your breast (if you are seated). Nursing pillows are specially designed to help support your arms and your baby while you breastfeed. °· Make sure that your baby's abdomen is facing your abdomen. °· Gently massage your breast. With your fingertips, massage from your chest wall toward your nipple in a circular motion. This encourages milk flow. You may need to continue this action during the feeding if your milk flows slowly. °· Support your breast with 4 fingers underneath and your thumb above your nipple. Make sure your fingers are well away from your nipple and your baby's mouth. °· Stroke your baby's lips gently with your finger or nipple. °· When your baby's mouth is open wide enough, quickly bring your baby to your breast, placing your entire nipple and as much of the colored area around your nipple (areola) as possible into your baby's mouth. °· More areola should be visible above your baby's upper lip than below the lower lip. °· Your baby's tongue should be between his or her lower gum and your breast. °· Ensure that your baby's mouth is correctly positioned around your nipple (latched). Your baby's lips should create a seal on your breast and be turned out (everted). °· It is common for your baby to suck about 2-3 minutes in order to start the flow of breast milk. °Latching °Teaching your baby how to latch on to your breast properly is very important. An improper latch can cause nipple pain and decreased milk supply for you and poor weight gain in your baby. Also, if your baby is not latched onto your nipple properly, he or she may swallow some air during  feeding. This can make your baby fussy. Burping your baby when you switch breasts during the feeding can help to get rid of the air. However, teaching your baby to latch on properly is still the best way to prevent fussiness from swallowing air while breastfeeding. °Signs that your baby has successfully latched on to your nipple: °· Silent tugging or silent sucking, without causing you pain. °· Swallowing heard between every 3-4 sucks. °· Muscle movement above and in front of his or her ears while sucking. °Signs that your baby has not successfully latched on to nipple: °· Sucking sounds or smacking sounds from your baby while breastfeeding. °· Nipple pain. °If you think your baby has not latched on correctly, slip your finger into the corner of your baby's mouth to break the suction and place it   between your baby's gums. Attempt breastfeeding initiation again. °Signs of Successful Breastfeeding °Signs from your baby: °· A gradual decrease in the number of sucks or complete cessation of sucking. °· Falling asleep. °· Relaxation of his or her body. °· Retention of a small amount of milk in his or her mouth. °· Letting go of your breast by himself or herself. °Signs from you: °· Breasts that have increased in firmness, weight, and size 1-3 hours after feeding. °· Breasts that are softer immediately after breastfeeding. °· Increased milk volume, as well as a change in milk consistency and color by the fifth day of breastfeeding. °· Nipples that are not sore, cracked, or bleeding. °Signs That Your Baby is Getting Enough Milk °· Wetting at least 3 diapers in a 24-hour period. The urine should be clear and pale yellow by age 5 days. °· At least 3 stools in a 24-hour period by age 5 days. The stool should be soft and yellow. °· At least 3 stools in a 24-hour period by age 7 days. The stool should be seedy and yellow. °· No loss of weight greater than 10% of birth weight during the first 3 days of age. °· Average weight  gain of 4-7 ounces (113-198 g) per week after age 4 days. °· Consistent daily weight gain by age 5 days, without weight loss after the age of 2 weeks. °After a feeding, your baby may spit up a small amount. This is common. °BREASTFEEDING FREQUENCY AND DURATION °Frequent feeding will help you make more milk and can prevent sore nipples and breast engorgement. Breastfeed when you feel the need to reduce the fullness of your breasts or when your baby shows signs of hunger. This is called "breastfeeding on demand." Avoid introducing a pacifier to your baby while you are working to establish breastfeeding (the first 4-6 weeks after your baby is born). After this time you may choose to use a pacifier. Research has shown that pacifier use during the first year of a baby's life decreases the risk of sudden infant death syndrome (SIDS). °Allow your baby to feed on each breast as long as he or she wants. Breastfeed until your baby is finished feeding. When your baby unlatches or falls asleep while feeding from the first breast, offer the second breast. Because newborns are often sleepy in the first few weeks of life, you may need to awaken your baby to get him or her to feed. °Breastfeeding times will vary from baby to baby. However, the following rules can serve as a guide to help you ensure that your baby is properly fed: °· Newborns (babies 4 weeks of age or younger) may breastfeed every 1-3 hours. °· Newborns should not go longer than 3 hours during the day or 5 hours during the night without breastfeeding. °· You should breastfeed your baby a minimum of 8 times in a 24-hour period until you begin to introduce solid foods to your baby at around 6 months of age. °BREAST MILK PUMPING °Pumping and storing breast milk allows you to ensure that your baby is exclusively fed your breast milk, even at times when you are unable to breastfeed. This is especially important if you are going back to work while you are still  breastfeeding or when you are not able to be present during feedings. Your lactation consultant can give you guidelines on how long it is safe to store breast milk. °A breast pump is a machine that allows you to pump milk   from your breast into a sterile bottle. The pumped breast milk can then be stored in a refrigerator or freezer. Some breast pumps are operated by hand, while others use electricity. Ask your lactation consultant which type will work best for you. Breast pumps can be purchased, but some hospitals and breastfeeding support groups lease breast pumps on a monthly basis. A lactation consultant can teach you how to hand express breast milk, if you prefer not to use a pump. °CARING FOR YOUR BREASTS WHILE YOU BREASTFEED °Nipples can become dry, cracked, and sore while breastfeeding. The following recommendations can help keep your breasts moisturized and healthy: °· Avoid using soap on your nipples. °· Wear a supportive bra. Although not required, special nursing bras and tank tops are designed to allow access to your breasts for breastfeeding without taking off your entire bra or top. Avoid wearing underwire-style bras or extremely tight bras. °· Air dry your nipples for 3-4 minutes after each feeding. °· Use only cotton bra pads to absorb leaked breast milk. Leaking of breast milk between feedings is normal. °· Use lanolin on your nipples after breastfeeding. Lanolin helps to maintain your skin's normal moisture barrier. If you use pure lanolin, you do not need to wash it off before feeding your baby again. Pure lanolin is not toxic to your baby. You may also hand express a few drops of breast milk and gently massage that milk into your nipples and allow the milk to air dry. °In the first few weeks after giving birth, some women experience extremely full breasts (engorgement). Engorgement can make your breasts feel heavy, warm, and tender to the touch. Engorgement peaks within 3-5 days after you give  birth. The following recommendations can help ease engorgement: °· Completely empty your breasts while breastfeeding or pumping. You may want to start by applying warm, moist heat (in the shower or with warm water-soaked hand towels) just before feeding or pumping. This increases circulation and helps the milk flow. If your baby does not completely empty your breasts while breastfeeding, pump any extra milk after he or she is finished. °· Wear a snug bra (nursing or regular) or tank top for 1-2 days to signal your body to slightly decrease milk production. °· Apply ice packs to your breasts, unless this is too uncomfortable for you. °· Make sure that your baby is latched on and positioned properly while breastfeeding. °If engorgement persists after 48 hours of following these recommendations, contact your health care provider or a lactation consultant. °OVERALL HEALTH CARE RECOMMENDATIONS WHILE BREASTFEEDING °· Eat healthy foods. Alternate between meals and snacks, eating 3 of each per day. Because what you eat affects your breast milk, some of the foods may make your baby more irritable than usual. Avoid eating these foods if you are sure that they are negatively affecting your baby. °· Drink milk, fruit juice, and water to satisfy your thirst (about 10 glasses a day). °· Rest often, relax, and continue to take your prenatal vitamins to prevent fatigue, stress, and anemia. °· Continue breast self-awareness checks. °· Avoid chewing and smoking tobacco. Chemicals from cigarettes that pass into breast milk and exposure to secondhand smoke may harm your baby. °· Avoid alcohol and drug use, including marijuana. °Some medicines that may be harmful to your baby can pass through breast milk. It is important to ask your health care provider before taking any medicine, including all over-the-counter and prescription medicine as well as vitamin and herbal supplements. °It is possible to become   pregnant while breastfeeding. If  birth control is desired, ask your health care provider about options that will be safe for your baby. °SEEK MEDICAL CARE IF: °· You feel like you want to stop breastfeeding or have become frustrated with breastfeeding. °· You have painful breasts or nipples. °· Your nipples are cracked or bleeding. °· Your breasts are red, tender, or warm. °· You have a swollen area on either breast. °· You have a fever or chills. °· You have nausea or vomiting. °· You have drainage other than breast milk from your nipples. °· Your breasts do not become full before feedings by the fifth day after you give birth. °· You feel sad and depressed. °· Your baby is too sleepy to eat well. °· Your baby is having trouble sleeping.   °· Your baby is wetting less than 3 diapers in a 24-hour period. °· Your baby has less than 3 stools in a 24-hour period. °· Your baby's skin or the white part of his or her eyes becomes yellow.   °· Your baby is not gaining weight by 5 days of age. °SEEK IMMEDIATE MEDICAL CARE IF: °· Your baby is overly tired (lethargic) and does not want to wake up and feed. °· Your baby develops an unexplained fever. °  °This information is not intended to replace advice given to you by your health care provider. Make sure you discuss any questions you have with your health care provider. °  °Document Released: 04/14/2005 Document Revised: 01/03/2015 Document Reviewed: 10/06/2012 °Elsevier Interactive Patient Education ©2016 [employer] ° °

## 2016-02-24 NOTE — Progress Notes (Signed)
Patient ID: Cathy Pineda, female   DOB: 08/07/1990, 25 y.o.   MRN: [id number] Subjective: S/P Primary Cesarean Delivery for Breech Presentation / SROM POD# 3 Information for the patient's newborn:  Cathy Pineda, Boy Cathy Pineda [[id number]]  female  / circ done  Reports feeling well. Feeding: breast Patient reports tolerating PO.  Breast symptoms: (+) Colostrum, difficulty with nursing d/t tight mouth and tongue of infant / using SNS system and working with LC Pain controlled with ibuprofen (OTC) and narcotic analgesics including Percocet Denies HA/SOB/C/P/N/V/dizziness. Flatus present. (+) BM. She reports vaginal bleeding as normal, without clots.  She is ambulating, urinating without difficult.     Objective:   VS:  Vitals:   02/22/16 1845 02/23/16 0700 02/23/16 1842 02/24/16 0456  BP: (!) 102/55 (!) 98/59 (!) 118/54 (!) 99/45  Pulse: 96 83 84 79  Resp: 18 18 16 18   Temp: 98.3 F (36.8 C) 97.6 F (36.4 C) 98.6 F (37 C) 97.8 F (36.6 C)  TempSrc: Oral Oral Oral Oral  SpO2:   99%   Weight:      Height:          Intake/Output Summary (Last 24 hours) at 02/24/16 0948 Last data filed at 02/23/16 1200  Gross per 24 hour  Intake              3.5 ml  Output                0 ml  Net              3.5 ml         Recent Labs  02/22/16 0511  WBC 9.9  HGB 9.7*  HCT 28.3*  PLT 133*     Blood type: A POS (10/26 0700)     Physical Exam:   General: alert, cooperative, fatigued and no distress  CV: Regular rate and rhythm, S1S2 present or without murmur or extra heart sounds  Resp: clear  Abdomen: soft, nontender, normal bowel sounds  Incision: Dressing C/D/I, skin well-approximaed with sutures  Uterine Fundus: firm, 3 FB below umbilicus, nontender  Lochia: minimal  Ext: extremities normal, atraumatic, no cyanosis or edema, Homans sign is negative, no sign of DVT and no edema, redness or tenderness in the calves or thighs   Assessment/Plan: 25 y.o.   POD# 3.  S/P Cesarean  Delivery.  Indications: breech / SROM                Principal Problem:   Postpartum care following cesarean delivery - BREECH (10/26) Active Problems:   Breech birth  Doing well, stable.               Regular diet as tolerated Ambulate Routine post-op care D/C Home today Plan outpt LC appts for additional help with BF issues   Cathy Pineda, Cathy Pineda, M, MSN, CNM 02/24/2016, 9:48 AM

## 2016-02-28 ENCOUNTER — Telehealth (HOSPITAL_COMMUNITY): Payer: Self-pay | Admitting: Lactation Services

## 2016-02-28 NOTE — Telephone Encounter (Signed)
Mom called, left message and I called her back. Reports baby is not nursing well. Will only nurse with NS and only if formula or breast milk is in it. When it is gone baby stops nursing. Saw Ped today and they said baby does not have tongue tie. Mom is pumping about 4 oz q 2-3 hours Is giving formula at night so baby will sleep better.  Has shells and is wearing them. Discussed SNS and mom said she got that at hospital but has not used it since home because it was too much. States she may try it since she really wants baby to latch. Encouraged lots of skin to skin time and nuzzling when baby is not real hungry. Suggested bottle feeding some of the feeding then trying at the breast. Suggested pumping a few minutes prior to nursing to erect nipple and get milk flowing.  Mom states she really wants to breast feed. Has appointment with us on Tuesday. We have cancellation for tomorrow but dad can not bring mom then. No further questions at present.

## 2016-03-04 ENCOUNTER — Ambulatory Visit (HOSPITAL_COMMUNITY)
Admission: RE | Admit: 2016-03-04 | Discharge: 2016-03-04 | Disposition: A | Discharge status: Home / Self Care | Payer: BLUE CROSS/BLUE SHIELD | Source: Ambulatory Visit | Attending: Obstetrics & Gynecology | Admitting: Obstetrics & Gynecology

## 2016-03-04 NOTE — Lactation Note (Signed)
Lactation Consult  Mother's reason for visit: Mother having a hard time with latch . She states that the latch is much better.  Visit Type:  Appointment Notes:mother states that latch is better with the #24 nipple shield. Mother uses shield at the beginning of the feeding and then removes the shield and latches to the bare breast.  Consult:  Initial Lactation Consultant:  Michel BickersKendrick, Taji Barretto McCoy  ________________________________________________________________________    ________________________________________________________________________  Mother's Name: Cathy Pineda Type of delivery:   Breastfeeding Experience:  Maternal Medical Conditions:  History post partum depression Mother states she has had some baby blues. Maternal Medications:   ________________________________________________________________________  Breastfeeding History (Post Discharge)  Frequency of breastfeeding:    every 2-3 ounces with a botte when not breastfeeding  Duration of feeding: 10-15 mins  Supplementation  Breastmilk:  Volume 2-3 ounces  Method:  Bottle   Infant Intake and Output Assessment  Voids:7   in 24 hrs.  Color:  Clear yellow Stools:7   in 24 hrs.  Color:  Yellow  ________________________________________________________________________  Maternal Breast Assessment  Breast:  Full Nipple:  Flat Pain level:  0 Pain interventions:  Bra  _______________________________________________________________________ Feeding Assessment/Evaluation: Observed mother independently latch infant using the nipple shield . Observed milk in the shield right away when infant latched onto breast. Infant sustained latch for 10-15 mins and transferred 28 ml    Infant's oral assessment:  WNL  Positioning:  Football Right breast  LATCH documentation:  Latch:  2 = Grasps breast easily, tongue down, lips flanged, rhythmical sucking.  Audible swallowing:  2 = Spontaneous and intermittent  Type of  nipple:  1 = Flat  Comfort (Breast/Nipple):  1 = Filling, red/small blisters or bruises, mild/mod discomfort  Hold (Positioning):  2 = No assistance needed to correctly position infant at breast  LATCH score:  8  Attached assessment:  Deep  Lips flanged:  Yes.    Lips untucked:  Yes.    Suck assessment:  Displays both  Tools:  Nipple shield 24 mm Instructed on use and cleaning of tool:  Yes.    Pre-feed weight: 8-9.4,3894  Post-feed weight: 1-61,09608-10,3922 Amount transferred:  28 ml   Total amount transferred:  28ml Advised mother to continue to breastfeed at least 8-12 times in 24 hours If feeding with a bottle offer 2-3 ounces. Mother to post pump 3-4 times daily for 15-20 mins.  Advised mother to nap frequently to prevent some of the baby blues Discussed baby blues S/S and encouraged mother to discuss feeling with family  If persist past 3 weeks contact her MD

## 2016-03-11 ENCOUNTER — Telehealth (HOSPITAL_COMMUNITY): Payer: Self-pay | Admitting: Lactation Services

## 2016-03-11 NOTE — Telephone Encounter (Addendum)
Patient left voice mail stating that she wanted to stop breastfeeding and how could she do so. I returned mom's call and spoke w/her. She may choose not to quit completely, but not to put the baby to the breast anymore and offer formula & expressed breast milk. Mom inquired about how often she would need to pump. I instructed mom to pump often enough so that she is not uncomfortably full. Mom also asked how to decrease pumping if she wants to stop lactating completely. I advised her to decrease the amount of time she pumps until her comfort level would allow her to drop an entire pumping session & then drop another pumping session every couple of days or so, as comfortable.  Glenetta HewKim Alroy Portela, RN, IBCLC

## 2017-07-09 ENCOUNTER — Other Ambulatory Visit (HOSPITAL_COMMUNITY)
Admission: RE | Admit: 2017-07-09 | Discharge: 2017-07-09 | Disposition: A | Discharge status: Home / Self Care | Payer: 59 | Source: Ambulatory Visit | Attending: Dentistry | Admitting: Dentistry

## 2018-05-06 DIAGNOSIS — Z0001 Encounter for general adult medical examination with abnormal findings: Secondary | ICD-10-CM | POA: Diagnosis not present

## 2018-05-06 DIAGNOSIS — R5383 Other fatigue: Secondary | ICD-10-CM | POA: Diagnosis not present

## 2018-05-06 DIAGNOSIS — E663 Overweight: Secondary | ICD-10-CM | POA: Diagnosis not present

## 2018-05-06 DIAGNOSIS — Z6826 Body mass index (BMI) 26.0-26.9, adult: Secondary | ICD-10-CM | POA: Diagnosis not present

## 2018-05-06 DIAGNOSIS — Z1389 Encounter for screening for other disorder: Secondary | ICD-10-CM | POA: Diagnosis not present

## 2018-05-27 DIAGNOSIS — J3489 Other specified disorders of nose and nasal sinuses: Secondary | ICD-10-CM | POA: Diagnosis not present

## 2018-05-27 DIAGNOSIS — R6889 Other general symptoms and signs: Secondary | ICD-10-CM | POA: Diagnosis not present

## 2018-05-27 DIAGNOSIS — J069 Acute upper respiratory infection, unspecified: Secondary | ICD-10-CM | POA: Diagnosis not present

## 2018-06-08 DIAGNOSIS — Z6835 Body mass index (BMI) 35.0-35.9, adult: Secondary | ICD-10-CM | POA: Diagnosis not present

## 2018-06-08 DIAGNOSIS — B372 Candidiasis of skin and nail: Secondary | ICD-10-CM | POA: Diagnosis not present

## 2018-06-08 DIAGNOSIS — E6609 Other obesity due to excess calories: Secondary | ICD-10-CM | POA: Diagnosis not present

## 2018-09-02 DIAGNOSIS — E6609 Other obesity due to excess calories: Secondary | ICD-10-CM | POA: Diagnosis not present

## 2018-09-02 DIAGNOSIS — Z6835 Body mass index (BMI) 35.0-35.9, adult: Secondary | ICD-10-CM | POA: Diagnosis not present

## 2019-02-03 ENCOUNTER — Other Ambulatory Visit: Payer: Self-pay | Admitting: *Deleted

## 2019-02-03 DIAGNOSIS — Z20822 Contact with and (suspected) exposure to covid-19: Secondary | ICD-10-CM

## 2019-02-05 LAB — NOVEL CORONAVIRUS, NAA: SARS-CoV-2, NAA: NOT DETECTED

## 2019-03-17 ENCOUNTER — Other Ambulatory Visit: Payer: Self-pay

## 2019-03-17 ENCOUNTER — Ambulatory Visit (INDEPENDENT_AMBULATORY_CARE_PROVIDER_SITE_OTHER): Payer: 59 | Admitting: Otolaryngology

## 2019-04-06 ENCOUNTER — Other Ambulatory Visit: Payer: Self-pay

## 2019-04-06 DIAGNOSIS — Z20822 Contact with and (suspected) exposure to covid-19: Secondary | ICD-10-CM

## 2019-04-07 LAB — NOVEL CORONAVIRUS, NAA: SARS-CoV-2, NAA: NOT DETECTED

## 2019-04-11 ENCOUNTER — Other Ambulatory Visit: Payer: Self-pay

## 2019-04-11 DIAGNOSIS — Z20822 Contact with and (suspected) exposure to covid-19: Secondary | ICD-10-CM

## 2019-04-12 LAB — NOVEL CORONAVIRUS, NAA: SARS-CoV-2, NAA: NOT DETECTED

## 2019-04-13 ENCOUNTER — Telehealth: Payer: Self-pay | Admitting: *Deleted

## 2019-04-13 NOTE — Telephone Encounter (Signed)
Patient given negative covied results .

## 2020-04-28 NOTE — L&D Delivery Note (Addendum)
Delivery Note-VBAC At 6:45 PM a viable and healthy female was delivered via Vaginal, Spontaneous (Presentation: Right Occiput Anterior).  APGAR: pending weight pending .   Placenta status: Spontaneous, Intact.  Cord: 3 vessels with the following complications:  .tight NCD cut on perineum  Cord pH: na  Anesthesia: Epidural Episiotomy:  midline Lacerations:  none Suture Repair: 2.0 vicryl rapide Est. Blood Loss (mL): 300  Mom to postpartum.  Baby to Couplet care / Skin to Skin.  Jailine Lieder J 02/14/2021, 7:03 PM

## 2020-07-05 ENCOUNTER — Emergency Department (HOSPITAL_COMMUNITY): Payer: 59

## 2020-07-05 ENCOUNTER — Other Ambulatory Visit: Payer: Self-pay

## 2020-07-05 ENCOUNTER — Emergency Department (HOSPITAL_COMMUNITY)
Admission: EM | Admit: 2020-07-05 | Discharge: 2020-07-05 | Disposition: A | Discharge status: Home / Self Care | Payer: 59 | Attending: Emergency Medicine | Admitting: Emergency Medicine

## 2020-07-05 ENCOUNTER — Encounter (HOSPITAL_COMMUNITY): Payer: Self-pay | Admitting: Emergency Medicine

## 2020-07-05 DIAGNOSIS — O4690 Antepartum hemorrhage, unspecified, unspecified trimester: Secondary | ICD-10-CM

## 2020-07-05 DIAGNOSIS — Z3A01 Less than 8 weeks gestation of pregnancy: Secondary | ICD-10-CM | POA: Diagnosis not present

## 2020-07-05 DIAGNOSIS — R58 Hemorrhage, not elsewhere classified: Secondary | ICD-10-CM

## 2020-07-05 DIAGNOSIS — O4691 Antepartum hemorrhage, unspecified, first trimester: Secondary | ICD-10-CM | POA: Insufficient documentation

## 2020-07-05 DIAGNOSIS — N939 Abnormal uterine and vaginal bleeding, unspecified: Secondary | ICD-10-CM | POA: Insufficient documentation

## 2020-07-05 LAB — CBC WITH DIFFERENTIAL/PLATELET
Abs Immature Granulocytes: 0.02 10*3/uL (ref 0.00–0.07)
Basophils Absolute: 0 10*3/uL (ref 0.0–0.1)
Basophils Relative: 0 %
Eosinophils Absolute: 0.2 10*3/uL (ref 0.0–0.5)
Eosinophils Relative: 2 %
HCT: 37.6 % (ref 36.0–46.0)
Hemoglobin: 12.7 g/dL (ref 12.0–15.0)
Immature Granulocytes: 0 %
Lymphocytes Relative: 29 %
Lymphs Abs: 2.3 10*3/uL (ref 0.7–4.0)
MCH: 31.4 pg (ref 26.0–34.0)
MCHC: 33.8 g/dL (ref 30.0–36.0)
MCV: 93.1 fL (ref 80.0–100.0)
Monocytes Absolute: 0.5 10*3/uL (ref 0.1–1.0)
Monocytes Relative: 7 %
Neutro Abs: 5 10*3/uL (ref 1.7–7.7)
Neutrophils Relative %: 62 %
Platelets: 157 10*3/uL (ref 150–400)
RBC: 4.04 MIL/uL (ref 3.87–5.11)
RDW: 12.5 % (ref 11.5–15.5)
WBC: 8.1 10*3/uL (ref 4.0–10.5)
nRBC: 0 % (ref 0.0–0.2)

## 2020-07-05 LAB — BASIC METABOLIC PANEL
Anion gap: 9 (ref 5–15)
BUN: 11 mg/dL (ref 6–20)
CO2: 25 mmol/L (ref 22–32)
Calcium: 9 mg/dL (ref 8.9–10.3)
Chloride: 102 mmol/L (ref 98–111)
Creatinine, Ser: 0.66 mg/dL (ref 0.44–1.00)
GFR, Estimated: >60 mL/min (ref >60)
Glucose, Bld: 111 mg/dL — ABNORMAL HIGH (ref 70–99)
Potassium: 3.7 mmol/L (ref 3.5–5.1)
Sodium: 136 mmol/L (ref 135–145)

## 2020-07-05 LAB — HCG, QUANTITATIVE, PREGNANCY: hCG, Beta Chain, Quant, S: 38111 m[IU]/mL — ABNORMAL HIGH (ref <5)

## 2020-07-05 LAB — POC URINE PREG, ED: Preg Test, Ur: POSITIVE — AB

## 2020-07-05 LAB — TYPE AND SCREEN
ABO/RH(D): A POS
Antibody Screen: NEGATIVE

## 2020-07-05 NOTE — ED Notes (Signed)
Patient transported to Ultrasound 

## 2020-07-05 NOTE — ED Triage Notes (Signed)
Pt states that she is estimating [redacted] weeks pregnant and started having some bleeding tonight.

## 2020-07-05 NOTE — ED Provider Notes (Signed)
[facility] EMERGENCY DEPARTMENT Provider Note   CSN: [id number] Arrival date & time: 07/05/20  1938     History Chief Complaint  Patient presents with  . Vaginal Bleeding    Cathy Pineda is a 30 y.o. female G2, P1 otherwise healthy.  Patient reports last menstrual cycle was near the end of January 2022.  She estimates that she may be about [redacted] weeks pregnant today.  Check a home birth control test last week which was positive she called her OB/GYN and has an appointment with them tomorrow morning.  If you hours prior to arrival patient was using the bathroom and wiped noticed some blood on the toilet paper.  She wiped several more times and noticed blood on the toilet paper each time, small amount.  She then came to the emergency department for further evaluation she has not had any prior prenatal care with this pregnancy.  She has a 19-year-old at home.   Denies fever/chills, chest pain, abdominal pain, pelvic pain, dysuria/hematuria or any additional concerns.  HPI     History reviewed. No pertinent past medical history.  Patient Active Problem List   Diagnosis Date Noted  . Postpartum care following cesarean delivery - BREECH (10/26) 02/21/2016  . Breech birth 02/21/2016    Past Surgical History:  Procedure Laterality Date  . CESAREAN SECTION N/A 02/21/2016   Procedure: CESAREAN SECTION;  Surgeon: Olivia Mackie, MD;  Location: [facility] BIRTHING SUITES;  Service: Obstetrics;  Laterality: N/A;     OB History    Gravida  2   Para  1   Term  1   Preterm      AB      Living  1     SAB      IAB      Ectopic      Multiple  0   Live Births  1           No family history on file.  Social History   Tobacco Use  . Smoking status: Never Smoker  . Smokeless tobacco: Never Used  Substance Use Topics  . Alcohol use: Yes  . Drug use: No    Home Medications Prior to Admission medications   Medication Sig Start Date End Date Taking? Authorizing Provider   acetaminophen (TYLENOL) 500 MG tablet Take 500 mg by mouth every 6 (six) hours as needed for mild pain, moderate pain or headache.    [provider]  ibuprofen (ADVIL,MOTRIN) 600 MG tablet Take 1 tablet (600 mg total) by mouth every 6 (six) hours. 02/24/16   Raelyn Mora, CNM  oxyCODONE-acetaminophen (PERCOCET/ROXICET) 5-325 MG tablet Take 2 tablets by mouth every 4 (four) hours as needed (pain scale > 7). 02/24/16   Raelyn Mora, CNM  Prenatal Vit-Fe Fumarate-FA (PRENATAL MULTIVITAMIN) TABS tablet Take 1 tablet by mouth at bedtime.    [provider]    Allergies    Patient has no known allergies.  Review of Systems   Review of Systems Ten systems are reviewed and are negative for acute change except as noted in the HPI  Physical Exam Updated Vital Signs BP 131/66   Pulse 74   Temp 98.2 F (36.8 C)   Resp 18   Ht 5\' 4"  (1.626 m)   Wt 88.5 kg   LMP 05/22/2020   SpO2 100%   BMI 33.47 kg/m   Physical Exam Constitutional:      General: She is not in acute distress.    Appearance:  Normal appearance. She is well-developed. She is not ill-appearing or diaphoretic.  HENT:     Head: Normocephalic and atraumatic.  Eyes:     General: Vision grossly intact. Gaze aligned appropriately.     Pupils: Pupils are equal, round, and reactive to light.  Neck:     Trachea: Trachea and phonation normal.  Pulmonary:     Effort: Pulmonary effort is normal. No respiratory distress.  Abdominal:     General: There is no distension.     Palpations: Abdomen is soft.     Tenderness: There is no abdominal tenderness. There is no guarding or rebound.  Musculoskeletal:        General: Normal range of motion.     Cervical back: Normal range of motion.  Skin:    General: Skin is warm and dry.  Neurological:     Mental Status: She is alert.     GCS: GCS eye subscore is 4. GCS verbal subscore is 5. GCS motor subscore is 6.     Comments: Speech is clear and goal oriented,  follows commands Major Cranial nerves without deficit, no facial droop Moves extremities without ataxia, coordination intact  Psychiatric:        Behavior: Behavior normal.     ED Results / Procedures / Treatments   Labs (all labs ordered are listed, but only abnormal results are displayed) Labs Reviewed  HCG, QUANTITATIVE, PREGNANCY - Abnormal; Notable for the following components:      Result Value   hCG, Beta Chain, Quant, S 38,111 (*)    All other components within normal limits  BASIC METABOLIC PANEL - Abnormal; Notable for the following components:   Glucose, Bld 111 (*)    All other components within normal limits  POC URINE PREG, ED - Abnormal; Notable for the following components:   Preg Test, Ur POSITIVE (*)    All other components within normal limits  CBC WITH DIFFERENTIAL/PLATELET  TYPE AND SCREEN    EKG None  Radiology US OB LESS THAN 14 WEEKS WITH OB TRANSVAGINAL  Result Date: 07/05/2020 CLINICAL DATA:  Vaginal bleeding., positive pregnancy test, gestational age by last menstrual period of 6 weeks 2 days. Last menstrual period 05/22/2020 EXAM: OBSTETRIC <14 WK [place] AND TRANSVAGINAL OB US TECHNIQUE: Both transabdominal and transvaginal ultrasound examinations were performed for complete evaluation of the gestation as well as the maternal uterus, adnexal regions, and pelvic cul-de-sac. Transvaginal technique was performed to assess early pregnancy. COMPARISON:  September 27, 2014 FINDINGS: Intrauterine gestational sac: Single Yolk sac:  Visualized Embryo:  Likely visualized adjacent to yolk sac. Cardiac Activity: Likely associated cardiac activity. Heart Rate: Measured at 187 beats per minute, not confirmed on grayscale clip images. CRL: 3 mm   6 w   0 d                  [place] EDC: 02/28/2021 Subchorionic hemorrhage:  None visualized. Maternal uterus/adnexae: Ovaries are not visualized. No adnexal masses. No free fluid in the pelvis. IMPRESSION: Single intrauterine pregnancy, very  early, with fetal heart rate on single image with 187 beats per minute. Small size limits assessment. In the setting of vaginal bleeding consider correlation with beta HCG and continued follow-up with ultrasound as warranted. Electronically Signed   By: Donzetta Kohut M.D.   On: 07/05/2020 21:39    Procedures Procedures   Medications Ordered in ED Medications - No data to display  ED Course  I have reviewed the triage vital signs and  the nursing notes.  Pertinent labs & imaging results that were available during my care of the patient were reviewed by me and considered in my medical decision making (see chart for details).    MDM Rules/Calculators/A&P                         Additional history obtained from: 1. Nursing notes from this visit. 2. Electronic medical record review. ------------------- 30 year old female approximately [redacted] weeks pregnant presented for concern of vaginal bleeding onset just prior to arrival no associated pain and no additional concerns.  No prior OB/GYN care with this pregnancy.  Discussed case with attending physician Dr. Estell Harpin, will obtain CBC, BMP, type and screen, hCG quantitative, pelvic ultrasound. - I ordered, reviewed and interpreted labs which include: CBC within normal limits, no leukocytosis to suggest infectious process, no anemia. BMP shows no emergent electrolyte derangement, AKI or gap. Urine pregnancy test was positive. hCG quantitative 38,111 which is within range for 6-week gestation. Type and screen a positive, no indication for RhoGam.  Ultrasound OB less than 14 weeks with OB transvaginal:  IMPRESSION:  Single intrauterine pregnancy, very early, with fetal heart rate on  single image with 187 beats per minute. Small size limits  assessment. In the setting of vaginal bleeding consider correlation  with beta HCG and continued follow-up with ultrasound as warranted.  - I held extensive discussion with patient and her husband about plan  of care.  Patient has an appointment with her OB/GYN tomorrow morning I have encouraged her to go to that appointment for further evaluation patient is aware that she will need follow-up blood work and imaging and close OB/GYN follow-up.  Patient states understanding.  Pelvic examination was deferred to patient's OB/GYN tomorrow morning.  There is no indication for further work-up or admission in the ER at this time, patient states understanding of care plan and is requesting discharge.  At this time there does not appear to be any evidence of an acute emergency medical condition and the patient appears stable for discharge with appropriate outpatient follow up. Diagnosis was discussed with patient who verbalizes understanding of care plan and is agreeable to discharge. I have discussed return precautions with patient and husband who verbalizes understanding. Patient encouraged to follow-up with their PCP. All questions answered.  Patient's case discussed with Dr. Estell Harpin who agrees with plan to discharge with follow-up.   Note: Portions of this report may have been transcribed using voice recognition software. Every effort was made to ensure accuracy; however, inadvertent computerized transcription errors may still be present. Final Clinical Impression(s) / ED Diagnoses Final diagnoses:  Vaginal bleeding in pregnancy    Rx / DC Orders ED Discharge Orders    None       Elizabeth Palau 07/05/20 2313    Bethann Berkshire, MD 07/08/20 1155

## 2020-07-05 NOTE — Discharge Instructions (Signed)
At this time there does not appear to be the presence of an emergent medical condition, however there is always the potential for conditions to change. Please read and follow the below instructions.  Please return to the Emergency Department immediately for any new or worsening symptoms. Go to your OB/GYN appointment tomorrow morning as scheduled for further evaluation.  You will need follow-up blood work and imaging to monitor your pregnancy.  Go to the nearest Emergency Department immediately if: You have fever or chills You have very bad cramps in your back or belly (abdomen). You pass large clots or a lot of tissue from your vagina. Your bleeding gets worse. You feel light-headed. You feel weak. You pass out (faint). You have chills. You are leaking fluid from your vagina. You have a gush of fluid from your vagina. You have any new/concerning or worsening of symptoms    Please read the additional information packets attached to your discharge summary.  Do not take your medicine if  develop an itchy rash, swelling in your mouth or lips, or difficulty breathing; call 911 and seek immediate emergency medical attention if this occurs.  You may review your lab tests and imaging results in their entirety on your MyChart account.  Please discuss all results of fully with your primary care provider and other specialist at your follow-up visit.  Note: Portions of this text may have been transcribed using voice recognition software. Every effort was made to ensure accuracy; however, inadvertent computerized transcription errors may still be present.

## 2020-07-11 ENCOUNTER — Encounter: Payer: 59 | Admitting: Women's Health

## 2021-02-01 LAB — OB RESULTS CONSOLE GBS: GBS: NEGATIVE

## 2021-02-13 ENCOUNTER — Other Ambulatory Visit: Payer: Self-pay

## 2021-02-13 ENCOUNTER — Encounter (HOSPITAL_COMMUNITY): Payer: Self-pay | Admitting: Obstetrics and Gynecology

## 2021-02-13 ENCOUNTER — Inpatient Hospital Stay (HOSPITAL_COMMUNITY)
Admission: AD | Admit: 2021-02-13 | Discharge: 2021-02-16 | DRG: 807 | Disposition: A | Discharge status: Home / Self Care | Payer: BC Managed Care – PPO | Attending: Obstetrics and Gynecology | Admitting: Obstetrics and Gynecology

## 2021-02-13 DIAGNOSIS — Z3A38 38 weeks gestation of pregnancy: Secondary | ICD-10-CM

## 2021-02-13 DIAGNOSIS — O34219 Maternal care for unspecified type scar from previous cesarean delivery: Secondary | ICD-10-CM | POA: Diagnosis not present

## 2021-02-13 DIAGNOSIS — O4292 Full-term premature rupture of membranes, unspecified as to length of time between rupture and onset of labor: Principal | ICD-10-CM | POA: Diagnosis present

## 2021-02-13 DIAGNOSIS — Z20822 Contact with and (suspected) exposure to covid-19: Secondary | ICD-10-CM | POA: Diagnosis present

## 2021-02-13 DIAGNOSIS — O9902 Anemia complicating childbirth: Secondary | ICD-10-CM | POA: Diagnosis present

## 2021-02-13 DIAGNOSIS — D509 Iron deficiency anemia, unspecified: Secondary | ICD-10-CM | POA: Diagnosis present

## 2021-02-13 HISTORY — DX: Anemia, unspecified: D64.9

## 2021-02-13 NOTE — MAU Note (Addendum)
Pt was squatting down picking up toys and leaked some clear fld. Continued to leak fld when in squatting position. Did not leak any fld on way to hospital and did not wear in a pad. Was 2cm today. No pain. Good FM

## 2021-02-14 ENCOUNTER — Inpatient Hospital Stay (HOSPITAL_COMMUNITY): Payer: BC Managed Care – PPO | Admitting: Anesthesiology

## 2021-02-14 ENCOUNTER — Encounter (HOSPITAL_COMMUNITY): Payer: Self-pay | Admitting: Obstetrics and Gynecology

## 2021-02-14 DIAGNOSIS — O34219 Maternal care for unspecified type scar from previous cesarean delivery: Secondary | ICD-10-CM | POA: Diagnosis present

## 2021-02-14 DIAGNOSIS — O9902 Anemia complicating childbirth: Secondary | ICD-10-CM | POA: Diagnosis present

## 2021-02-14 DIAGNOSIS — Z20822 Contact with and (suspected) exposure to covid-19: Secondary | ICD-10-CM | POA: Diagnosis present

## 2021-02-14 DIAGNOSIS — O4292 Full-term premature rupture of membranes, unspecified as to length of time between rupture and onset of labor: Secondary | ICD-10-CM | POA: Diagnosis present

## 2021-02-14 DIAGNOSIS — D509 Iron deficiency anemia, unspecified: Secondary | ICD-10-CM | POA: Diagnosis present

## 2021-02-14 DIAGNOSIS — O26893 Other specified pregnancy related conditions, third trimester: Secondary | ICD-10-CM | POA: Diagnosis present

## 2021-02-14 DIAGNOSIS — Z3A38 38 weeks gestation of pregnancy: Secondary | ICD-10-CM | POA: Diagnosis not present

## 2021-02-14 LAB — RESP PANEL BY RT-PCR (FLU A&B, COVID) ARPGX2
Influenza A by PCR: NEGATIVE
Influenza B by PCR: NEGATIVE
SARS Coronavirus 2 by RT PCR: NEGATIVE

## 2021-02-14 LAB — CBC
HCT: 33.7 % — ABNORMAL LOW (ref 36.0–46.0)
Hemoglobin: 10.9 g/dL — ABNORMAL LOW (ref 12.0–15.0)
MCH: 29.9 pg (ref 26.0–34.0)
MCHC: 32.3 g/dL (ref 30.0–36.0)
MCV: 92.6 fL (ref 80.0–100.0)
Platelets: 153 10*3/uL (ref 150–400)
RBC: 3.64 MIL/uL — ABNORMAL LOW (ref 3.87–5.11)
RDW: 13.3 % (ref 11.5–15.5)
WBC: 10.9 10*3/uL — ABNORMAL HIGH (ref 4.0–10.5)
nRBC: 0 % (ref 0.0–0.2)

## 2021-02-14 LAB — POCT FERN TEST
POCT Fern Test: NEGATIVE
POCT Fern Test: POSITIVE

## 2021-02-14 LAB — RPR: RPR Ser Ql: NONREACTIVE

## 2021-02-14 LAB — TYPE AND SCREEN
ABO/RH(D): A POS
Antibody Screen: NEGATIVE

## 2021-02-14 MED ORDER — ACETAMINOPHEN 325 MG PO TABS: 650.0000 mg | ORAL_TABLET | ORAL | Status: DC | PRN

## 2021-02-14 MED ORDER — TETANUS-DIPHTH-ACELL PERTUSSIS 5-2.5-18.5 LF-MCG/0.5 IM SUSY: 0.5000 mL | PREFILLED_SYRINGE | Freq: Once | INTRAMUSCULAR | Status: DC

## 2021-02-14 MED ORDER — LACTATED RINGERS IV SOLN: 500.0000 mL | INTRAVENOUS | Status: DC | PRN

## 2021-02-14 MED ORDER — SIMETHICONE 80 MG PO CHEW: 80.0000 mg | CHEWABLE_TABLET | ORAL | Status: DC | PRN

## 2021-02-14 MED ORDER — PHENYLEPHRINE 40 MCG/ML (10ML) SYRINGE FOR IV PUSH (FOR BLOOD PRESSURE SUPPORT): 80.0000 ug | PREFILLED_SYRINGE | INTRAVENOUS | Status: DC | PRN

## 2021-02-14 MED ORDER — ONDANSETRON HCL 4 MG PO TABS
4.0000 mg | ORAL_TABLET | ORAL | Status: DC | PRN
  Filled 2021-02-14: qty 1

## 2021-02-14 MED ORDER — EPHEDRINE 5 MG/ML INJ: 10.0000 mg | INTRAVENOUS | Status: DC | PRN

## 2021-02-14 MED ORDER — PRENATAL MULTIVITAMIN CH
1.0000 | ORAL_TABLET | Freq: Every day | ORAL | Status: DC
  Administered 2021-02-15 – 2021-02-16 (×2): 1 via ORAL
  Filled 2021-02-14 (×2): qty 1

## 2021-02-14 MED ORDER — TERBUTALINE SULFATE 1 MG/ML IJ SOLN: 0.2500 mg | Freq: Once | INTRAMUSCULAR | Status: DC | PRN

## 2021-02-14 MED ORDER — LACTATED RINGERS IV SOLN: 500.0000 mL | Freq: Once | INTRAVENOUS | Status: DC

## 2021-02-14 MED ORDER — OXYCODONE-ACETAMINOPHEN 5-325 MG PO TABS
2.0000 | ORAL_TABLET | ORAL | Status: DC | PRN
Start: 2021-02-14 — End: 2021-02-16

## 2021-02-14 MED ORDER — LIDOCAINE HCL (PF) 1 % IJ SOLN: 30.0000 mL | INTRAMUSCULAR | Status: DC | PRN

## 2021-02-14 MED ORDER — IBUPROFEN 600 MG PO TABS
600.0000 mg | ORAL_TABLET | Freq: Four times a day (QID) | ORAL | Status: DC
  Administered 2021-02-14 – 2021-02-16 (×7): 600 mg via ORAL
  Filled 2021-02-14 (×7): qty 1

## 2021-02-14 MED ORDER — WITCH HAZEL-GLYCERIN EX PADS: 1.0000 "application " | MEDICATED_PAD | CUTANEOUS | Status: DC | PRN

## 2021-02-14 MED ORDER — METHYLERGONOVINE MALEATE 0.2 MG PO TABS
0.2000 mg | ORAL_TABLET | ORAL | Status: DC | PRN
Start: 2021-02-14 — End: 2021-02-16

## 2021-02-14 MED ORDER — SENNOSIDES-DOCUSATE SODIUM 8.6-50 MG PO TABS
2.0000 | ORAL_TABLET | Freq: Every day | ORAL | Status: DC
  Administered 2021-02-15 – 2021-02-16 (×2): 2 via ORAL
  Filled 2021-02-14 (×2): qty 2

## 2021-02-14 MED ORDER — OXYCODONE-ACETAMINOPHEN 5-325 MG PO TABS: 1.0000 | ORAL_TABLET | ORAL | Status: DC | PRN

## 2021-02-14 MED ORDER — SOD CITRATE-CITRIC ACID 500-334 MG/5ML PO SOLN: 30.0000 mL | ORAL | Status: DC | PRN

## 2021-02-14 MED ORDER — FENTANYL-BUPIVACAINE-NACL 0.5-0.125-0.9 MG/250ML-% EP SOLN
12.0000 mL/h | EPIDURAL | Status: DC | PRN
  Administered 2021-02-14: 12 mL/h via EPIDURAL
  Filled 2021-02-14: qty 250

## 2021-02-14 MED ORDER — ONDANSETRON HCL 4 MG/2ML IJ SOLN
4.0000 mg | Freq: Four times a day (QID) | INTRAMUSCULAR | Status: DC | PRN
  Administered 2021-02-14: 4 mg via INTRAVENOUS
  Filled 2021-02-14: qty 2

## 2021-02-14 MED ORDER — FLEET ENEMA 7-19 GM/118ML RE ENEM: 1.0000 | ENEMA | RECTAL | Status: DC | PRN

## 2021-02-14 MED ORDER — ESCITALOPRAM OXALATE 10 MG PO TABS
10.0000 mg | ORAL_TABLET | Freq: Every day | ORAL | Status: DC
  Administered 2021-02-15 – 2021-02-16 (×2): 10 mg via ORAL
  Filled 2021-02-14 (×2): qty 1

## 2021-02-14 MED ORDER — OXYCODONE-ACETAMINOPHEN 5-325 MG PO TABS: 2.0000 | ORAL_TABLET | ORAL | Status: DC | PRN

## 2021-02-14 MED ORDER — DIBUCAINE (PERIANAL) 1 % EX OINT
1.0000 "application " | TOPICAL_OINTMENT | CUTANEOUS | Status: DC | PRN
  Administered 2021-02-15: 1 via RECTAL
  Filled 2021-02-14: qty 28

## 2021-02-14 MED ORDER — ZOLPIDEM TARTRATE 5 MG PO TABS: 5.0000 mg | ORAL_TABLET | Freq: Every evening | ORAL | Status: DC | PRN

## 2021-02-14 MED ORDER — ACETAMINOPHEN 325 MG PO TABS
650.0000 mg | ORAL_TABLET | ORAL | Status: DC | PRN
  Administered 2021-02-15 – 2021-02-16 (×5): 650 mg via ORAL
  Filled 2021-02-14 (×5): qty 2

## 2021-02-14 MED ORDER — OXYCODONE-ACETAMINOPHEN 5-325 MG PO TABS
1.0000 | ORAL_TABLET | ORAL | Status: DC | PRN
Start: 2021-02-14 — End: 2021-02-16

## 2021-02-14 MED ORDER — BENZOCAINE-MENTHOL 20-0.5 % EX AERO
1.0000 "application " | INHALATION_SPRAY | CUTANEOUS | Status: DC | PRN
  Filled 2021-02-14 (×2): qty 56

## 2021-02-14 MED ORDER — ONDANSETRON HCL 4 MG/2ML IJ SOLN
4.0000 mg | INTRAMUSCULAR | Status: DC | PRN
  Administered 2021-02-14: 4 mg via INTRAVENOUS
  Filled 2021-02-14: qty 2

## 2021-02-14 MED ORDER — OXYTOCIN-SODIUM CHLORIDE 30-0.9 UT/500ML-% IV SOLN
1.0000 m[IU]/min | INTRAVENOUS | Status: DC
  Administered 2021-02-14: 1 m[IU]/min via INTRAVENOUS
  Filled 2021-02-14: qty 500

## 2021-02-14 MED ORDER — METHYLERGONOVINE MALEATE 0.2 MG/ML IJ SOLN: 0.2000 mg | INTRAMUSCULAR | Status: DC | PRN

## 2021-02-14 MED ORDER — DIPHENHYDRAMINE HCL 25 MG PO CAPS: 25.0000 mg | ORAL_CAPSULE | Freq: Four times a day (QID) | ORAL | Status: DC | PRN

## 2021-02-14 MED ORDER — COCONUT OIL OIL: 1.0000 "application " | TOPICAL_OIL | Status: DC | PRN

## 2021-02-14 MED ORDER — OXYTOCIN-SODIUM CHLORIDE 30-0.9 UT/500ML-% IV SOLN
2.5000 [IU]/h | INTRAVENOUS | Status: DC
  Administered 2021-02-14: 2.5 [IU]/h via INTRAVENOUS

## 2021-02-14 MED ORDER — OXYTOCIN BOLUS FROM INFUSION
333.0000 mL | Freq: Once | INTRAVENOUS | Status: AC
  Administered 2021-02-14: 333 mL via INTRAVENOUS

## 2021-02-14 MED ORDER — DIPHENHYDRAMINE HCL 50 MG/ML IJ SOLN: 12.5000 mg | INTRAMUSCULAR | Status: DC | PRN

## 2021-02-14 MED ORDER — LACTATED RINGERS IV SOLN: INTRAVENOUS | Status: DC

## 2021-02-14 MED ORDER — LIDOCAINE HCL (PF) 1 % IJ SOLN
INTRAMUSCULAR | Status: DC | PRN
  Administered 2021-02-14: 3 mL via EPIDURAL
  Administered 2021-02-14: 2 mL via EPIDURAL
  Administered 2021-02-14: 5 mL via EPIDURAL

## 2021-02-14 NOTE — MAU Provider Note (Addendum)
Chief Complaint:  Rupture of Membranes   Event Date/Time   First Provider Initiated Contact with Patient 02/14/21 0038     HPI: Cathy Pineda is a 30 y.o. G2P1001 at 73w2dwho presents to maternity admissions reporting leaking fluid while squatting at home.  Intermittently leaks.  Has occasional mild contractions. . She reports good fetal movement, denies vaginal bleeding, vaginal itching/burning, urinary symptoms, h/a, dizziness, n/v, diarrhea, constipation or fever/chills. .  Vaginal Discharge The patient's primary symptoms include vaginal discharge. The patient's pertinent negatives include no genital itching, genital lesions, genital odor, pelvic pain or vaginal bleeding. This is a new problem. The current episode started today. Pertinent negatives include no chills, fever or headaches. The vaginal discharge was clear and watery. There has been no bleeding. She has not been passing clots. She has not been passing tissue. Nothing aggravates the symptoms. She has tried nothing for the symptoms.     Past Medical History: Past Medical History:  Diagnosis Date   Anemia     Past obstetric history: OB History  Gravida Para Term Preterm AB Living  2 1 1     1   SAB IAB Ectopic Multiple Live Births        0 1    # Outcome Date GA Lbr Len/2nd Weight Sex Delivery Anes PTL Lv  2 Current           1 Term 02/21/16 [redacted]w[redacted]d  4070 g M CS-LTranv Spinal  LIV     Birth Comments: normal exam- hips flexed- no clicks    Past Surgical History: Past Surgical History:  Procedure Laterality Date   CESAREAN SECTION N/A 02/21/2016   Procedure: CESAREAN SECTION;  Surgeon: 02/23/2016, MD;  Location: [facility] BIRTHING SUITES;  Service: Obstetrics;  Laterality: N/A;    Family History: History reviewed. No pertinent family history.  Social History: Social History   Tobacco Use   Smoking status: Never   Smokeless tobacco: Never  Substance Use Topics   Alcohol use: Yes   Drug use: No    Allergies: No  Known Allergies  Meds:  Medications Prior to Admission  Medication Sig Dispense Refill Last Dose   escitalopram (LEXAPRO) 10 MG tablet Take 10 mg by mouth daily.   02/12/2021   famotidine (PEPCID) 20 MG tablet Take 20 mg by mouth 2 (two) times daily.   02/12/2021   Prenatal Vit-Fe Fumarate-FA (PRENATAL MULTIVITAMIN) TABS tablet Take 1 tablet by mouth at bedtime.   02/12/2021   acetaminophen (TYLENOL) 500 MG tablet Take 500 mg by mouth every 6 (six) hours as needed for mild pain, moderate pain or headache.      ibuprofen (ADVIL,MOTRIN) 600 MG tablet Take 1 tablet (600 mg total) by mouth every 6 (six) hours. 30 tablet 0    oxyCODONE-acetaminophen (PERCOCET/ROXICET) 5-325 MG tablet Take 2 tablets by mouth every 4 (four) hours as needed (pain scale > 7). 30 tablet 0     I have reviewed patient's Past Medical Hx, Surgical Hx, Family Hx, Social Hx, medications and allergies.   ROS:  Review of Systems  Constitutional:  Negative for chills and fever.  Genitourinary:  Positive for vaginal discharge. Negative for pelvic pain.  Neurological:  Negative for headaches.  Other systems negative  Physical Exam  Patient Vitals for the past 24 hrs:  BP Temp Pulse Resp SpO2 Height Weight  02/13/21 2358 118/73 -- 86 -- -- -- --  02/13/21 2341 129/82 -- -- -- -- -- --  02/13/21 2340 -- 97.9 F (  36.6 C) 84 18 98 % -- --  02/13/21 2338 -- -- -- -- -- 5\' 4"  (1.626 m) 116.6 kg   Constitutional: Well-developed, well-nourished female in no acute distress.  Cardiovascular: normal rate  Respiratory: normal effort GI: Abd soft, non-tender, gravid appropriate for gestational age.   MS: Extremities nontender, no edema, normal ROM Neurologic: Alert and oriented x 4.  GU: Neg CVAT. SVE deferred due to no labor  FHT:  Baseline 130 , moderate variability, accelerations present, no decelerations Contractions:  Irregular     Labs: Results for orders placed or performed during the hospital encounter of 02/13/21  (from the past 24 hour(s))  POCT fern test     Status: Normal   Collection Time: 02/14/21 12:14 AM  Result Value Ref Range   POCT Fern Test Negative = intact amniotic membranes   Fern Test     Status: Normal   Collection Time: 02/14/21 12:45 AM  Result Value Ref Range   POCT Fern Test Positive = ruptured amniotic membanes     --/--/A POS (03/10 2031)  Imaging:  Pt informed that the ultrasound is considered a limited OB ultrasound and is not intended to be a complete ultrasound exam.  Patient also informed that the ultrasound is not being completed with the intent of assessing for fetal or placental anomalies or any pelvic abnormalities.  Explained that the purpose of today's ultrasound is to assess for presentation.  Patient acknowledges the purpose of the exam and the limitations of the study.    Fetus is found in the longitudinal lie Vertex presentation  MAU Course/MDM: RN collected fern sample and did see ferning.  NST reviewed, reactive, category I  Treatments in MAU included EFM, 2032 for presentation  Assessment: Single IUP at [redacted]w[redacted]d PROM at term Prior C/S delivery, desires TOLAC  Plan: Admit to Labor and Delivery MD to follow   [redacted]w[redacted]d CNM, MSN Certified Nurse-Midwife 02/14/2021 12:39 AM

## 2021-02-14 NOTE — Lactation Note (Signed)
This note was copied from a baby's chart. Lactation Consultation Note  Patient Name: Cathy Pineda [id number] Date: 02/14/2021 Reason for consult: L&D Initial assessment;Mother's request;Early term 37-38.6wks;Other (Comment);Breastfeeding assistance (Anemia) Age:30 hours  LC assisted with latching infant to breast with signs of milk transfer.  Mom denied any pain with the latch. Mom to receive further LC support on the floor.  All questions answered at the end of the visit.   Maternal Data Has patient been taught Hand Expression?: Yes  Feeding Mother's Current Feeding Choice: Breast Milk  LATCH Score Latch: Repeated attempts needed to sustain latch, nipple held in mouth throughout feeding, stimulation needed to elicit sucking reflex.  Audible Swallowing: Spontaneous and intermittent  Type of Nipple: Everted at rest and after stimulation  Comfort (Breast/Nipple): Soft / non-tender  Hold (Positioning): Assistance needed to correctly position infant at breast and maintain latch.  LATCH Score: 8   Lactation Tools Discussed/Used    Interventions Interventions: Breast feeding basics reviewed;Support pillows;Education;Skin to skin;Hand express;Breast compression;Adjust position  Discharge    Consult Status Consult Status: Follow-up from L&D Date: 02/15/21 Follow-up type: In-patient    Avid Guillette  [name] 02/14/2021, 7:38 PM

## 2021-02-14 NOTE — Progress Notes (Signed)
                                                                                                                                                                                                                                                                                                                       Cathy Pineda is a 30 y.o. G2P1001 at [redacted]w[redacted]d by ultrasound admitted for  SROM at term.  Previous C/S for breech, TOLAC.   Subjective: MD request for IUPC placement.  Patient received epidural, still feeling some discomfort on R side but improving.  Dicussed IUPC placement, R/B reviewed, patient consents to procedure.    Objective: Vitals:   02/14/21 0956 02/14/21 1000 02/14/21 1001 02/14/21 1014  BP: 119/72  116/73   Pulse: 84  82   Resp:  18  18  Temp:      TempSrc:      SpO2:      Weight:      Height:        FHT:  FHR: 125 bpm, variability: moderate,  accelerations:  Present,  decelerations:  Absent UC:   regular, every 2-3 minutes SVE:   Dilation: 4 Effacement (%): 80 Station: -2 Exam by:: Arloa Koh, cnm IUPC placed, minimal fluid returned, pink/clear  Labs:   Recent Labs    02/14/21 0116  WBC 10.9*  HGB 10.9*  HCT 33.7*  PLT 153    Assessment / Plan: G2P1001 30 y.o. [redacted]w[redacted]d Previous C/S-breech, SROM, for TOLAC  Labor:  Pitocin augmentation, IUPC placed for titration Preeclampsia:  no signs or symptoms of toxicity Fetal Wellbeing:  Category I Pain Control:  Epidural I/D:   GBS neg Anticipated MOD:   Cautious for VBAC  Neta Mends, CNM, MSN 02/14/2021, 10:22 AM

## 2021-02-14 NOTE — Progress Notes (Signed)
Cathy Pineda is a 30 y.o. G2P1001 at [redacted]w[redacted]d by LMP admitted for active labor, rupture of membranes  Subjective: comfortable  Objective: BP (!) 148/86   Pulse 92   Temp 98.4 F (36.9 C) (Axillary)   Resp 15   Ht 5\' 4"  (1.626 m)   Wt 116.6 kg   LMP 05/22/2020   SpO2 98%   BMI 44.11 kg/m  No intake/output data recorded. Total I/O In: -  Out: 450 [Urine:450]  FHT:  FHR: 155 bpm, variability: moderate,  accelerations:  Present,  decelerations:  Absent UC:   regular, every 2-3 minutes- 180 MVU SVE:   Dilation: 7 Effacement (%): 100 Station: Plus 1 Exam by:: [email or web address], RN  Labs: Lab Results  Component Value Date   WBC 10.9 (H) 02/14/2021   HGB 10.9 (L) 02/14/2021   HCT 33.7 (L) 02/14/2021   MCV 92.6 02/14/2021   PLT 153 02/14/2021    Assessment / Plan: Augmentation of labor, progressing well TOLAC  Labor: Progressing normally Preeclampsia:  no signs or symptoms of toxicity Fetal Wellbeing:  Category I Pain Control:  Epidural I/D:  n/a Anticipated MOD:  NSVD  Carder Yin J 02/14/2021, 3:54 PM

## 2021-02-14 NOTE — Progress Notes (Signed)
Pt had gotten up to BR and EFm reapplied

## 2021-02-14 NOTE — Anesthesia Procedure Notes (Signed)
Epidural Patient location during procedure: OB Start time: 02/14/2021 9:22 AM End time: 02/14/2021 9:29 AM  Staffing Anesthesiologist: Marcene Duos, MD Performed: anesthesiologist   Preanesthetic Checklist Completed: patient identified, IV checked, site marked, risks and benefits discussed, surgical consent, monitors and equipment checked, pre-op evaluation and timeout performed  Epidural Patient position: sitting Prep: DuraPrep and site prepped and draped Patient monitoring: continuous pulse ox and blood pressure Approach: midline Location: L3-L4 Injection technique: LOR air  Needle:  Needle type: Tuohy  Needle gauge: 17 G Needle length: 9 cm and 9 Needle insertion depth: 7.5 cm Catheter type: closed end flexible Catheter size: 19 Gauge Catheter at skin depth: 14 (12.5--> 14cm when pt sat upright.) cm Test dose: negative  Assessment Events: blood not aspirated, injection not painful, no injection resistance, no paresthesia and negative IV test

## 2021-02-14 NOTE — H&P (Signed)
Cathy Pineda is a 30 y.o. female presenting for SROM at term. OB History     Gravida  2   Para  1   Term  1   Preterm      AB      Living  1      SAB      IAB      Ectopic      Multiple  0   Live Births  1          Past Medical History:  Diagnosis Date   Anemia    Past Surgical History:  Procedure Laterality Date   CESAREAN SECTION N/A 02/21/2016   Procedure: CESAREAN SECTION;  Surgeon: Olivia Mackie, MD;  Location: [facility] BIRTHING SUITES;  Service: Obstetrics;  Laterality: N/A;   Family History: family history is not on file. Social History:  reports that she has never smoked. She has never used smokeless tobacco. She reports current alcohol use. She reports that she does not use drugs.     Maternal Diabetes: No Genetic Screening: Normal Maternal Ultrasounds/Referrals: Normal Fetal Ultrasounds or other Referrals:  None Maternal Substance Abuse:  No Significant Maternal Medications:  None Significant Maternal Lab Results:  Group B Strep negative Other Comments:  None  Review of Systems  Constitutional: Negative.   All other systems reviewed and are negative. Maternal Medical History:  Reason for admission: Rupture of membranes.   Contractions: Onset was 1-2 hours ago.   Frequency: irregular.   Perceived severity is mild.   Fetal activity: Perceived fetal activity is normal.   Last perceived fetal movement was within the past hour.   Prenatal complications: no prenatal complications Prenatal Complications - Diabetes: none.  Dilation: 2.5 Effacement (%): 50 Station: -2 Exam by:: E Chipps RN Blood pressure 118/73, pulse 86, temperature 98.6 F (37 C), temperature source Oral, resp. rate 18, height 5\' 4"  (1.626 m), weight 116.6 kg, last menstrual period 05/22/2020, SpO2 98 %, unknown if currently breastfeeding. Maternal Exam:  Uterine Assessment: Contraction strength is mild.  Contraction frequency is irregular.  Abdomen: Patient reports no  abdominal tenderness. Fetal presentation: vertex Introitus: Normal vulva. Normal vagina.  Ferning test: positive.  Nitrazine test: positive. Amniotic fluid character: clear. Pelvis: questionable for delivery.   Cervix: Cervix evaluated by digital exam.    Physical Exam Vitals reviewed.  Constitutional:      Appearance: Normal appearance. She is normal weight.  HENT:     Head: Normocephalic and atraumatic.  Cardiovascular:     Rate and Rhythm: Normal rate and regular rhythm.     Pulses: Normal pulses.     Heart sounds: Normal heart sounds.  Pulmonary:     Effort: Pulmonary effort is normal.     Breath sounds: Normal breath sounds.  Abdominal:     General: Abdomen is flat.     Palpations: Abdomen is soft.  Genitourinary:    General: Normal vulva.  Musculoskeletal:        General: Normal range of motion.     Cervical back: Normal range of motion and neck supple.  Skin:    General: Skin is warm and dry.  Neurological:     General: No focal deficit present.     Mental Status: She is alert and oriented to person, place, and time.  Psychiatric:        Mood and Affect: Mood normal.        Behavior: Behavior normal.    Prenatal labs: ABO, Rh: --/--/A POS (10/20  5790) Antibody: NEG (10/20 0125) Rubella:  imm RPR:   neg HBsAg:   neg HIV:   neg GBS: Negative/-- (10/07 0000)   Assessment/Plan: Term IU Previous csection for breech TOLAC   Chirag Krueger J 02/14/2021, 6:33 AM

## 2021-02-14 NOTE — Progress Notes (Signed)
To 214 via w/c

## 2021-02-14 NOTE — Anesthesia Preprocedure Evaluation (Signed)
Anesthesia Evaluation  Patient identified by MRN, date of birth, ID band Patient awake    Reviewed: Allergy & Precautions, Patient's Chart, lab work & pertinent test results  Airway Mallampati: III  TM Distance: >3 FB     Dental   Pulmonary neg pulmonary ROS,    Pulmonary exam normal        Cardiovascular negative cardio ROS Normal cardiovascular exam     Neuro/Psych negative neurological ROS     GI/Hepatic negative GI ROS, Neg liver ROS,   Endo/Other  Morbid obesity  Renal/GU negative Renal ROS     Musculoskeletal   Abdominal   Peds  Hematology  (+) anemia ,   Anesthesia Other Findings   Reproductive/Obstetrics (+) Pregnancy (TOLAC. )                             Lab Results  Component Value Date   WBC 10.9 (H) 02/14/2021   HGB 10.9 (L) 02/14/2021   HCT 33.7 (L) 02/14/2021   MCV 92.6 02/14/2021   PLT 153 02/14/2021   Lab Results  Component Value Date   CREATININE 0.66 07/05/2020   BUN 11 07/05/2020   NA 136 07/05/2020   K 3.7 07/05/2020   CL 102 07/05/2020   CO2 25 07/05/2020    Anesthesia Physical Anesthesia Plan  ASA: 3  Anesthesia Plan: Epidural   Post-op Pain Management:    Induction:   PONV Risk Score and Plan: Treatment may vary due to age or medical condition  Airway Management Planned: Natural Airway  Additional Equipment:   Intra-op Plan:   Post-operative Plan:   Informed Consent: I have reviewed the patients History and Physical, chart, labs and discussed the procedure including the risks, benefits and alternatives for the proposed anesthesia with the patient or authorized representative who has indicated his/her understanding and acceptance.       Plan Discussed with:   Anesthesia Plan Comments:         Anesthesia Quick Evaluation

## 2021-02-15 LAB — CBC
HCT: 29.1 % — ABNORMAL LOW (ref 36.0–46.0)
Hemoglobin: 9.6 g/dL — ABNORMAL LOW (ref 12.0–15.0)
MCH: 30.4 pg (ref 26.0–34.0)
MCHC: 33 g/dL (ref 30.0–36.0)
MCV: 92.1 fL (ref 80.0–100.0)
Platelets: 127 10*3/uL — ABNORMAL LOW (ref 150–400)
RBC: 3.16 MIL/uL — ABNORMAL LOW (ref 3.87–5.11)
RDW: 13.5 % (ref 11.5–15.5)
WBC: 13.9 10*3/uL — ABNORMAL HIGH (ref 4.0–10.5)
nRBC: 0.1 % (ref 0.0–0.2)

## 2021-02-15 MED ORDER — MAGNESIUM OXIDE -MG SUPPLEMENT 400 (240 MG) MG PO TABS
400.0000 mg | ORAL_TABLET | Freq: Every day | ORAL | Status: DC
  Administered 2021-02-15 – 2021-02-16 (×2): 400 mg via ORAL
  Filled 2021-02-15 (×2): qty 1

## 2021-02-15 MED ORDER — POLYSACCHARIDE IRON COMPLEX 150 MG PO CAPS
150.0000 mg | ORAL_CAPSULE | Freq: Every day | ORAL | Status: DC
  Administered 2021-02-15 – 2021-02-16 (×2): 150 mg via ORAL
  Filled 2021-02-15 (×2): qty 1

## 2021-02-15 NOTE — Anesthesia Postprocedure Evaluation (Signed)
Anesthesia Post Note  Patient: Cathy Pineda  Procedure(s) Performed: AN AD HOC LABOR EPIDURAL     Patient location during evaluation: Mother Baby Anesthesia Type: Epidural Level of consciousness: awake and alert Pain management: pain level controlled Vital Signs Assessment: post-procedure vital signs reviewed and stable Respiratory status: spontaneous breathing, nonlabored ventilation and respiratory function stable Cardiovascular status: stable Postop Assessment: no headache, no backache and epidural receding Anesthetic complications: no   No notable events documented.  Last Vitals:  Vitals:   02/15/21 0225 02/15/21 0600  BP: 116/67 (!) 95/57  Pulse: 88 89  Resp: 19 17  Temp: 36.4 C 36.6 C  SpO2: 100% 97%    Last Pain:  Vitals:   02/15/21 0600  TempSrc: Oral  PainSc: 5    Pain Goal:                   Rica Records

## 2021-02-15 NOTE — Lactation Note (Signed)
This note was copied from a baby's chart. Lactation Consultation Note  Patient Name: Cathy Pineda [id number] Date: 02/15/2021 Reason for consult: Follow-up assessment;Mother's request;Difficult latch;Early term 37-38.6wks Age:30 hours  Mom having trouble waking infant after circ. LC assisted with spoon feeding 3 ml of colostrum. LC did some suck training getting infant to latch to my finger. He can extend his tongue pass the gum line. Once we established a strong latch on my finger he latched to the breast and feeding well.   Mom to call for further latch assistance if needed.  Maternal Data Has patient been taught Hand Expression?: Yes  Feeding Mother's Current Feeding Choice: Breast Milk  LATCH Score Latch: Repeated attempts needed to sustain latch, nipple held in mouth throughout feeding, stimulation needed to elicit sucking reflex.  Audible Swallowing: A few with stimulation  Type of Nipple: Everted at rest and after stimulation  Comfort (Breast/Nipple): Soft / non-tender  Hold (Positioning): Assistance needed to correctly position infant at breast and maintain latch.  LATCH Score: 7   Lactation Tools Discussed/Used    Interventions Interventions: Breast feeding basics reviewed;Support pillows;Education;Assisted with latch;Position options;Skin to skin;Expressed milk;Breast massage;Hand express;Breast compression;Adjust position;Infant Driven Feeding Algorithm education  Discharge Pump: Personal  Consult Status Consult Status: Follow-up Date: 02/16/21 Follow-up type: In-patient    Cathy Pineda  [name] 02/15/2021, 6:47 PM

## 2021-02-15 NOTE — Lactation Note (Signed)
This note was copied from a baby's chart. Lactation Consultation Note  Patient Name: Cathy Pineda [id number] Date: 02/15/2021 Reason for consult: Initial assessment;Early term 37-38.6wks Age:30 hours   P2 mother whose infant is now 2 hours old.  This is an ETI at 38+2 weeks. Mother breast fed her first child for a couple of months.  Baby was swaddled and asleep in the bassinet when I arrived.  Mother feels like her son is latching and breast feeding well.  She had no questions/concerns related to breast feeding and stated she was a "little overwhelmed" with her first child.  She does not desire lactation visits unless she calls, therefore, I will make her a "prn" status.  Reassured mother that I am available for any questions/concerns she may have.  Mother appreciative.  Mother will feed 8-12 times/24 hours or sooner if baby shows feeding cues.  Father present.  RN updated.   Maternal Data Has patient been taught Hand Expression?: Yes (Mother familar with hand expression)  Feeding Mother's Current Feeding Choice: Breast Milk  LATCH Score                    Lactation Tools Discussed/Used    Interventions    Discharge    Consult Status Consult Status: PRN Date: 02/15/21 Follow-up type: In-patient    Dora Sims 02/15/2021, 8:39 AM

## 2021-02-15 NOTE — Progress Notes (Signed)
    PPD # 1 S/P VBAC  Live born female  Birth Weight: 7 lb 15 oz (3600 g) APGAR: 5, 6  Newborn Delivery   Birth date/time: 02/14/2021 18:45:00 Delivery type: VBAC, Spontaneous     Baby name: Dickie La Delivering provider: Olivia Mackie  Episiotomy:Median   Lacerations:2nd degree;Perineal   circumcision planned inpatient  Feeding: breast  Pain control at delivery: Epidural   S:  Reports feeling well, tired from labor yesterday but happy with VBAC.             Tolerating po/ No nausea or vomiting             Bleeding is light             Pain controlled with acetaminophen and ibuprofen (OTC)             Up ad lib / ambulatory / voiding without difficulties   O:  A & O x 3, in no apparent distress              VS:  Vitals:   02/14/21 2115 02/14/21 2220 02/15/21 0225 02/15/21 0600  BP: 115/75 118/75 116/67 (!) 95/57  Pulse: 82 94 88 89  Resp: 17 18 19 17   Temp: 98.1 F (36.7 C) (!) 97.3 F (36.3 C) 97.6 F (36.4 C) 97.8 F (36.6 C)  TempSrc: Oral Axillary Axillary Oral  SpO2: 100% 100% 100% 97%  Weight:      Height:        LABS:  Recent Labs    02/14/21 0116 02/15/21 0559  WBC 10.9* 13.9*  HGB 10.9* 9.6*  HCT 33.7* 29.1*  PLT 153 127*    Blood type: --/--/A POS (10/20 0125)  Rubella:   immune  I&O: I/O last 3 completed shifts: In: 0  Out: 750 [Urine:450; Blood:300]          No intake/output data recorded.  Vaccines: TDaP          needed         Flu             UTD                    COVID-19 UTD  Gen: AAO x 3, NAD  Abdomen: soft, non-tender, non-distended             Fundus: firm, non-tender, U-1  Perineum: repair intact  Lochia: small  Extremities: trace oedal edema, no calf pain or tenderness    A/P: PPD # 1 30 y.o., 08-17-1972   Principal Problem:   Postpartum care following VBAC 10/20 Active Problems:   Indication for care in labor or delivery   VBAC, delivered   Obstetrical laceration - midline episiotomy   Maternal anemia, with delivery -  IDA  - asymptomatic  - started oral Fe and Mag Ox   Doing well - stable status  Routine post partum orders  Anticipate discharge tomorrow  TDaP prior to DC    11/20, MSN, CNM 02/15/2021, 7:49 AM

## 2021-02-15 NOTE — Social Work (Signed)
MOB was referred for history of anxiety.  * Referral screened out by Clinical Social Worker because none of the following criteria appear to apply: ~ History of anxiety/depression during this pregnancy, or of post-partum depression following prior delivery. ~ Diagnosis of anxiety and/or depression within last 3 years OR  * MOB's symptoms currently being treated with medication and/or therapy. Per chart review, MOB takes Lexapro10mg  for anxiety symptoms.  Please contact the Clinical Social Worker if needs arise, by [facility] request, or if MOB scores greater than 9/yes to question 10 on Edinburgh Postpartum Depression Screen.   Vivi Barrack, MSW, LCSW [facility]  Clinical Social Worker  [phone] 02/15/2021  8:35 AM

## 2021-02-16 MED ORDER — IBUPROFEN 600 MG PO TABS: 600.0000 mg | ORAL_TABLET | Freq: Four times a day (QID) | ORAL | 0 refills | Status: DC

## 2021-02-16 MED ORDER — POLYSACCHARIDE IRON COMPLEX 150 MG PO CAPS: 150.0000 mg | ORAL_CAPSULE | Freq: Every day | ORAL | 1 refills | Status: AC

## 2021-02-16 MED ORDER — ACETAMINOPHEN 325 MG PO TABS: 650.0000 mg | ORAL_TABLET | ORAL | 1 refills | Status: AC | PRN

## 2021-02-16 MED ORDER — BENZOCAINE-MENTHOL 20-0.5 % EX AERO: 1.0000 "application " | INHALATION_SPRAY | CUTANEOUS | 1 refills | Status: AC | PRN

## 2021-02-16 MED ORDER — MAGNESIUM OXIDE -MG SUPPLEMENT 400 (240 MG) MG PO TABS: 400.0000 mg | ORAL_TABLET | Freq: Every day | ORAL | 1 refills | Status: AC

## 2021-02-16 NOTE — Discharge Summary (Signed)
OB Discharge Summary  Patient Name: Cathy Pineda DOB: Feb 12, 1991 MRN: [id number]  Date of admission: 02/13/2021 Delivering provider: Olivia Mackie   Admitting diagnosis: Indication for care in labor or delivery [O75.9] Intrauterine pregnancy: [redacted]w[redacted]d     Secondary diagnosis: Patient Active Problem List   Diagnosis Date Noted   Indication for care in labor or delivery 02/14/2021   VBAC, delivered 02/14/2021   Obstetrical laceration - midline episiotomy 02/14/2021   Maternal anemia, with delivery - IDA 02/14/2021   Postpartum care following VBAC 10/20 02/21/2016    Date of discharge: 02/16/2021   Discharge diagnosis: Principal Problem:   Postpartum care following VBAC 10/20 Active Problems:   Indication for care in labor or delivery   VBAC, delivered   Obstetrical laceration - midline episiotomy   Maternal anemia, with delivery - IDA                                                             Post partum procedures: None  Augmentation: AROM and Pitocin Pain control: Epidural  Laceration:2nd degree;Perineal  Episiotomy:Median  Complications: None  Hospital course:  Onset of Labor With Vaginal Delivery      30 y.o. yo [id number] at [redacted]w[redacted]d was admitted in Latent Labor on 02/13/2021. Patient had an uncomplicated labor course as follows:  Membrane Rupture Time/Date: 8:15 AM ,02/14/2021   Delivery Method:VBAC, Spontaneous  Episiotomy: Median  Lacerations:  2nd degree;Perineal  Patient had an uncomplicated postpartum course.  She is ambulating, tolerating a regular diet, passing flatus, and urinating well. Patient is discharged home in stable condition on 02/16/21.  Newborn Data: Birth date:02/14/2021  Birth time:6:45 PM  Gender:Female  Living status:Living  Apgars:5 ,6  Weight:3600 g   Physical exam  Vitals:   02/15/21 1027 02/15/21 1450 02/15/21 2144 02/16/21 0620  BP: 119/70 120/75 (!) 114/59 117/68  Pulse: 94 89 93 74  Resp: 18 17 18 18   Temp: 98.7 F (37.1 C) 98.2 F  (36.8 C) 98.4 F (36.9 C) 98.1 F (36.7 C)  TempSrc: Oral Oral Oral Oral  SpO2: 100% 99% 99% 100%  Weight:      Height:       General: alert, cooperative, and no distress Lochia: appropriate Uterine Fundus: firm Incision: N/A Perineum: repair intact, no edema DVT Evaluation: No evidence of DVT seen on physical exam. No significant calf/ankle edema.  Labs: Lab Results  Component Value Date   WBC 13.9 (H) 02/15/2021   HGB 9.6 (L) 02/15/2021   HCT 29.1 (L) 02/15/2021   MCV 92.1 02/15/2021   PLT 127 (L) 02/15/2021   CMP Latest Ref Rng & Units 07/05/2020  Glucose 70 - 99 mg/dL 09/04/2020)  BUN 6 - 20 mg/dL 11  Creatinine 209(O - 7.09 mg/dL 6.28  Sodium 3.66 - 294 mmol/L 136  Potassium 3.5 - 5.1 mmol/L 3.7  Chloride 98 - 111 mmol/L 102  CO2 22 - 32 mmol/L 25  Calcium 8.9 - 10.3 mg/dL 9.0  Total Protein 6.5 - 8.1 g/dL -  Total Bilirubin 0.3 - 1.2 mg/dL -  Alkaline Phos 38 - 765 U/L -  AST 15 - 41 U/L -  ALT 14 - 54 U/L -   Edinburgh Postnatal Depression Scale Screening Tool 02/16/2021 02/15/2021  I have been able to laugh and see the funny side  of things. 0 (No Data)  I have looked forward with enjoyment to things. 0 -  I have blamed myself unnecessarily when things went wrong. 1 -  I have been anxious or worried for no good reason. 0 -  I have felt scared or panicky for no good reason. 0 -  Things have been getting on top of me. 1 -  I have been so unhappy that I have had difficulty sleeping. 0 -  I have felt sad or miserable. 0 -  I have been so unhappy that I have been crying. 0 -  The thought of harming myself has occurred to me. 0 -  Edinburgh Postnatal Depression Scale Total 2 -   Vaccines: TDaP prior to discharge         Flu    UTD         COVID-19   UTD  Discharge instructions:  per After Visit Summary  After Visit Meds:  Allergies as of 02/16/2021   No Known Allergies      Medication List     STOP taking these medications    famotidine 20 MG  tablet Commonly known as: PEPCID   oxyCODONE-acetaminophen 5-325 MG tablet Commonly known as: PERCOCET/ROXICET       TAKE these medications    acetaminophen 325 MG tablet Commonly known as: Tylenol Take 2 tablets (650 mg total) by mouth every 4 (four) hours as needed (for pain scale < 4). What changed:  medication strength how much to take when to take this reasons to take this   benzocaine-Menthol 20-0.5 % Aero Commonly known as: DERMOPLAST Apply 1 application topically as needed for irritation (perineal discomfort).   escitalopram 10 MG tablet Commonly known as: LEXAPRO Take 10 mg by mouth daily.   ibuprofen 600 MG tablet Commonly known as: ADVIL Take 1 tablet (600 mg total) by mouth every 6 (six) hours.   iron polysaccharides 150 MG capsule Commonly known as: Ferrex 150 Take 1 capsule (150 mg total) by mouth daily.   magnesium oxide 400 (240 Mg) MG tablet Commonly known as: MAG-OX Take 1 tablet (400 mg total) by mouth daily. Start taking on: February 17, 2021   prenatal multivitamin Tabs tablet Take 1 tablet by mouth at bedtime.               Discharge Care Instructions  (From admission, onward)           Start     Ordered   02/16/21 0000  Discharge wound care:       Comments: Sitz baths 2 times /day with warm water x 1 week. May add herbals: 1 ounce dried comfrey leaf* 1 ounce calendula flowers 1 ounce lavender flowers  Supplies can be found online at [employer] sources at [employer], [employer]  1/2 ounce dried uva ursi leaves 1/2 ounce witch hazel blossoms (if you can find them) 1/2 ounce dried sage leaf 1/2 cup sea salt Directions: Bring 2 quarts of water to a boil. Turn off heat, and place 1 ounce (approximately 1 large handful) of the above mixed herbs (not the salt) into the pot. Steep, covered, for 30 minutes.  Strain the liquid well with a fine mesh strainer, and discard the herb material. Add 2 quarts of liquid to  the tub, along with the 1/2 cup of salt. This medicinal liquid can also be made into compresses and peri-rinses.   02/16/21 1125  Diet: routine diet  Activity: Advance as tolerated. Pelvic rest for 6 weeks.   Newborn Data: Live born female  Birth Weight: 7 lb 15 oz (3600 g) APGAR: 5, 6  Newborn Delivery   Birth date/time: 02/14/2021 18:45:00 Delivery type: VBAC, Spontaneous     Named Brody Baby Feeding: Breast Disposition:home with mother  Delivery Report:  Review the Delivery Report for details.    Follow up:  Follow-up Information     Olivia Mackie, MD. Schedule an appointment as soon as possible for a visit in 6 week(s).   Specialty: Obstetrics and Gynecology Contact information: [address] [place] [address] [phone]                Clancy Gourd, MSN 02/16/2021, 11:25 AM

## 2021-02-16 NOTE — Lactation Note (Signed)
This note was copied from a baby's chart. Lactation Consultation Note  Patient Name: Cathy Pineda [id number] Date: 02/16/2021 Reason for consult: Follow-up assessment Age:30 hours   P2 mother whose infant is now 76 hours old.  This is an ETI at 38+2 weeks.  Mother breast fed her first child for a couple of months.  Mother called for assistance due to baby being sleepy with feeding.  When I arrived RN was in the room assisting with care.  Mother was attempting to latch baby.  She had already hand expressed some colostrum to help awaken him.  Mother interested in using the football hold but wanted to latch without assistance from me.  Verbalized tips on how to support breast and baby during latching.  Also reminded her to keep fingers away from the nipple when latching.  Baby would latch but not sustain.  Mother tried repeatedly and I suggested she do some more hand expression and feed back her colostrum allowing time for him to rest now.  He is not interested in latching and the continued attempts were frustrating him.    Mother tried her manual pump with no colostrum expressed; provided a #27 flange size.  Also gave coconut oil for comfort.  Mother expressed a desire to use hand expression only and not the manual pump.  Acknowledged her request and encouraged continued practice.  Mother has a DEBP for home use.  Father and visitor present.  NP and RN updated.   Maternal Data    Feeding    LATCH Score Latch: Too sleepy or reluctant, no latch achieved, no sucking elicited.  Audible Swallowing: None  Type of Nipple: Everted at rest and after stimulation  Comfort (Breast/Nipple): Soft / non-tender  Hold (Positioning): Assistance needed to correctly position infant at breast and maintain latch.  LATCH Score: 5   Lactation Tools Discussed/Used    Interventions Interventions: Education  Discharge Discharge Education: Engorgement and breast care Pump: Personal  Consult  Status Consult Status: Complete Date: 02/16/21 Follow-up type: Call as needed    Concettina Leth R Collan Schoenfeld 02/16/2021, 10:27 AM

## 2021-02-26 ENCOUNTER — Telehealth (HOSPITAL_COMMUNITY): Payer: Self-pay | Admitting: *Deleted

## 2021-02-26 NOTE — Telephone Encounter (Signed)
Hospital Discharge Follow-Up Call:  Patient reports that she is well and has no concerns about her healing process.  EPDS today was 3 and she endorses this accurately reflects that she is doing well emotionally.  She says  that she "battled depression" in January and is now on medication and has a therapist with whom she had a session today.  She said that she feels "better than ever."  She declines information about postpartum groups.  Patient says that baby is well and she has no concerns about baby's health.  She reports that baby sleeps in a bassinet.  Reviewed ABCs of Safe Sleep.

## 2021-09-19 ENCOUNTER — Encounter: Payer: Self-pay | Admitting: Emergency Medicine

## 2021-09-19 ENCOUNTER — Ambulatory Visit
Admission: EM | Admit: 2021-09-19 | Discharge: 2021-09-19 | Disposition: A | Discharge status: Home / Self Care | Payer: BC Managed Care – PPO | Attending: Nurse Practitioner | Admitting: Nurse Practitioner

## 2021-09-19 DIAGNOSIS — J069 Acute upper respiratory infection, unspecified: Secondary | ICD-10-CM

## 2021-09-19 LAB — POCT MONO SCREEN (KUC): Mono, POC: NEGATIVE

## 2021-09-19 NOTE — ED Triage Notes (Signed)
Fatigue and headache and sore throat on Monday.  Home Covid test was negative.  States lymph nodes are swollen in neck.  Fever yesterday.  States has been taking and old Rx of Augmentin with out relief.

## 2021-09-19 NOTE — Discharge Instructions (Signed)
As discussed, your symptoms may not be getting better with antibiotic because this may be a viral infection. Strep test was deferred as you have been on antibiotics already. Increase fluids and allow for plenty of rest. May continue ibuprofen.  Recommend taking 3 to 4 tablets every 8 hours with food and water. Warm salt water gargles 3-4 times daily as needed while symptoms persist. Follow-up as needed.

## 2021-09-19 NOTE — ED Provider Notes (Signed)
[facility]    CSN: [id number] Arrival date & time: 09/19/21  1440      History   Chief Complaint Chief Complaint  Patient presents with   Sore Throat    Entered by patient    HPI Cathy Pineda is a 31 y.o. female.   The patient is a 31 year old female who presents with sore throat, fatigue, and swollen neck nodes.  Symptoms have been present for the past 3 days.  Patient states her last fever was 102 last evening.  States that she started amoxicillin and has and took 2 doses of that and then was called in Augmentin by her dentist.  She also states that she has had diarrhea as a result of the antibiotic.  She denies cough, nasal congestion, abdominal pain, nausea or vomiting.  Patient states she went to her nephew's birthday party over the weekend and he had the same or similar symptoms.  The history is provided by the patient.  Sore Throat   Past Medical History:  Diagnosis Date   Anemia     Patient Active Problem List   Diagnosis Date Noted   Indication for care in labor or delivery 02/14/2021   VBAC, delivered 02/14/2021   Obstetrical laceration - midline episiotomy 02/14/2021   Maternal anemia, with delivery - IDA 02/14/2021   Postpartum care following VBAC 10/20 02/21/2016    Past Surgical History:  Procedure Laterality Date   CESAREAN SECTION N/A 02/21/2016   Procedure: CESAREAN SECTION;  Surgeon: Olivia Mackie, MD;  Location: [facility] BIRTHING SUITES;  Service: Obstetrics;  Laterality: N/A;    OB History     Gravida  2   Para  2   Term  2   Preterm      AB      Living  2      SAB      IAB      Ectopic      Multiple  0   Live Births  2            Home Medications    Prior to Admission medications   Medication Sig Start Date End Date Taking? Authorizing Provider  acetaminophen (TYLENOL) 325 MG tablet Take 2 tablets (650 mg total) by mouth every 4 (four) hours as needed (for pain scale < 4). 02/16/21   June Leap, CNM   benzocaine-Menthol (DERMOPLAST) 20-0.5 % AERO Apply 1 application topically as needed for irritation (perineal discomfort). 02/16/21   June Leap, CNM  escitalopram (LEXAPRO) 10 MG tablet Take 10 mg by mouth daily.    [provider]  ibuprofen (ADVIL) 600 MG tablet Take 1 tablet (600 mg total) by mouth every 6 (six) hours. 02/16/21   June Leap, CNM  iron polysaccharides (FERREX 150) 150 MG capsule Take 1 capsule (150 mg total) by mouth daily. 02/16/21   June Leap, CNM  magnesium oxide (MAG-OX) 400 (240 Mg) MG tablet Take 1 tablet (400 mg total) by mouth daily. 02/17/21   June Leap, CNM  Prenatal Vit-Fe Fumarate-FA (PRENATAL MULTIVITAMIN) TABS tablet Take 1 tablet by mouth at bedtime.    [provider]    Family History History reviewed. No pertinent family history.  Social History Social History   Tobacco Use   Smoking status: Never   Smokeless tobacco: Never  Substance Use Topics   Alcohol use: Yes   Drug use: No     Allergies   Patient has no known allergies.  Review of Systems Review of Systems   Physical Exam Triage Vital Signs ED Triage Vitals  Enc Vitals Group     BP 09/19/21 1501 130/81     Pulse Rate 09/19/21 1501 99     Resp 09/19/21 1501 18     Temp 09/19/21 1501 98.3 F (36.8 C)     Temp Source 09/19/21 1501 Oral     SpO2 09/19/21 1501 95 %     Weight --      Height --      Head Circumference --      Peak Flow --      Pain Score 09/19/21 1503 8     Pain Loc --      Pain Edu? --      Excl. in GC? --    No data found.  Updated Vital Signs BP 130/81   Pulse 99   Temp 98.3 F (36.8 C) (Oral)   Resp 18   SpO2 95%   Breastfeeding Yes   Visual Acuity Right Eye Distance:   Left Eye Distance:   Bilateral Distance:    Right Eye Near:   Left Eye Near:    Bilateral Near:     Physical Exam Constitutional:      General: She is not in acute distress.    Appearance: She is well-developed.  HENT:      Head: Normocephalic.     Right Ear: Tympanic membrane and ear canal normal.     Left Ear: Tympanic membrane and ear canal normal.     Nose: No congestion.     Mouth/Throat:     Mouth: Mucous membranes are moist.     Pharynx: Uvula midline. Pharyngeal swelling and posterior oropharyngeal erythema present. No oropharyngeal exudate or uvula swelling.     Tonsils: 1+ on the right. 1+ on the left.  Eyes:     Conjunctiva/sclera: Conjunctivae normal.     Pupils: Pupils are equal, round, and reactive to light.  Cardiovascular:     Rate and Rhythm: Normal rate and regular rhythm.     Heart sounds: Normal heart sounds.  Abdominal:     General: Bowel sounds are normal.     Palpations: Abdomen is soft.  Musculoskeletal:     Cervical back: Normal range of motion.  Skin:    General: Skin is warm and dry.     Capillary Refill: Capillary refill takes less than 2 seconds.  Neurological:     General: No focal deficit present.     Mental Status: She is alert and oriented to person, place, and time.  Psychiatric:        Mood and Affect: Mood normal.        Behavior: Behavior normal.     UC Treatments / Results  Labs (all labs ordered are listed, but only abnormal results are displayed) Labs Reviewed  POCT MONO SCREEN [facility])    EKG   Radiology No results found.  Procedures Procedures (including critical care time)  Medications Ordered in UC Medications - No data to display  Initial Impression / Assessment and Plan / UC Course  I have reviewed the triage vital signs and the nursing notes.  Pertinent labs & imaging results that were available during my care of the patient were reviewed by me and considered in my medical decision making (see chart for details).  MDM Number of Diagnoses or Management Options Acute upper respiratory infection: established, worsening Risk of Complications, Morbidity, and/or Mortality Presenting problems: low  Management options: low General comments:  Patient presents with upper respiratory symptoms x 3 days. Patient has been taking antibiotics with no relief. Symptoms are most likely of viral origin. Her vital signs and exam are reassuring. Mono test was negative. Strep test was not indicated as patient has been taking abx. Patient advised to take OTC medication to help with pain or fever. Supportive care to include increasing fluids, rest. Explained to patient viral infections may take up to 10 to 14 days for resolutions. Follow up as needed.   Patient Progress Patient progress: stable   Final Clinical Impressions(s) / UC Diagnoses   Final diagnoses:  Acute upper respiratory infection     Discharge Instructions      As discussed, your symptoms may not be getting better with antibiotic because this may be a viral infection. Strep test was deferred as you have been on antibiotics already. Increase fluids and allow for plenty of rest. May continue ibuprofen.  Recommend taking 3 to 4 tablets every 8 hours with food and water. Warm salt water gargles 3-4 times daily as needed while symptoms persist. Follow-up as needed.     ED Prescriptions   None    PDMP not reviewed this encounter.   Abran Cantor, NP 09/19/21 1746

## 2021-10-28 ENCOUNTER — Ambulatory Visit
Admission: EM | Admit: 2021-10-28 | Discharge: 2021-10-28 | Disposition: A | Discharge status: Home / Self Care | Payer: Medicaid Other | Attending: Nurse Practitioner | Admitting: Nurse Practitioner

## 2021-10-28 DIAGNOSIS — H66001 Acute suppurative otitis media without spontaneous rupture of ear drum, right ear: Secondary | ICD-10-CM

## 2021-10-28 MED ORDER — AMOXICILLIN-POT CLAVULANATE 875-125 MG PO TABS: 1.0000 | ORAL_TABLET | Freq: Two times a day (BID) | ORAL | 0 refills | Status: AC

## 2021-10-28 MED ORDER — IBUPROFEN 800 MG PO TABS: 800.0000 mg | ORAL_TABLET | Freq: Three times a day (TID) | ORAL | 0 refills | Status: AC | PRN

## 2021-10-28 NOTE — ED Provider Notes (Signed)
[facility]    CSN: [id number] Arrival date & time: 10/28/21  1429      History   Chief Complaint Chief Complaint  Patient presents with   Ear Fullness    Entered by patient   Otalgia    HPI Cathy Pineda is a 31 y.o. female.   Patient presents with right ear pain that has suddenly worsened over the weekend.  She reports a couple of months ago, she was seen by her primary care provider and was told she had fluid behind her ears.  She had been taking over-the-counter allergy medicine, however reports she thinks this decreased her milk supply, so she stopped it.  Denies drainage from the ear, fevers, cough, congestion.  Reports the pain in her right ear is severe.  Denies recent water immersion, use of Q-tips.  Has taken ibuprofen with minimal relief of symptoms.    Past Medical History:  Diagnosis Date   Anemia     Patient Active Problem List   Diagnosis Date Noted   Indication for care in labor or delivery 02/14/2021   VBAC, delivered 02/14/2021   Obstetrical laceration - midline episiotomy 02/14/2021   Maternal anemia, with delivery - IDA 02/14/2021   Postpartum care following VBAC 10/20 02/21/2016    Past Surgical History:  Procedure Laterality Date   CESAREAN SECTION N/A 02/21/2016   Procedure: CESAREAN SECTION;  Surgeon: Olivia Mackie, MD;  Location: [facility] BIRTHING SUIT[facility];  Service: Obstetrics;  Laterality: N/A;    OB History     Gravida  2   Para  2   Term  2   Preterm      AB      Living  2      SAB      IAB      Ectopic      Multiple  0   Live Births  2            Home Medications    Prior to Admission medications   Medication Sig Start Date End Date Taking? Authorizing Provider  amoxicillin-clavulanate (AUGMENTIN) 875-125 MG tablet Take 1 tablet by mouth 2 (two) times daily for 7 days. 10/28/21 11/04/21 Yes Valentino Nose, NP  ibuprofen (ADVIL) 800 MG tablet Take 1 tablet (800 mg total) by mouth every 8 (eight)  hours as needed. Take with food to prevent GI upset 10/28/21  Yes Valentino Nose, NP  acetaminophen (TYLENOL) 325 MG tablet Take 2 tablets (650 mg total) by mouth every 4 (four) hours as needed (for pain scale < 4). 02/16/21   June Leap, CNM  benzocaine-Menthol (DERMOPLAST) 20-0.5 % AERO Apply 1 application topically as needed for irritation (perineal discomfort). 02/16/21   June Leap, CNM  escitalopram (LEXAPRO) 10 MG tablet Take 10 mg by mouth daily.    [provider]  iron polysaccharides (FERREX 150) 150 MG capsule Take 1 capsule (150 mg total) by mouth daily. 02/16/21   June Leap, CNM  magnesium oxide (MAG-OX) 400 (240 Mg) MG tablet Take 1 tablet (400 mg total) by mouth daily. 02/17/21   June Leap, CNM  Prenatal Vit-Fe Fumarate-FA (PRENATAL MULTIVITAMIN) TABS tablet Take 1 tablet by mouth at bedtime.    [provider]    Family History History reviewed. No pertinent family history.  Social History Social History   Tobacco Use   Smoking status: Never   Smokeless tobacco: Never  Substance Use Topics   Alcohol use: Yes  Drug use: No     Allergies   Patient has no known allergies.   Review of Systems Review of Systems Per HPI  Physical Exam Triage Vital Signs ED Triage Vitals  Enc Vitals Group     BP 10/28/21 1515 99/60     Pulse Rate 10/28/21 1515 86     Resp 10/28/21 1515 15     Temp 10/28/21 1515 98.5 F (36.9 C)     Temp Source 10/28/21 1515 Oral     SpO2 10/28/21 1515 96 %     Weight --      Height --      Head Circumference --      Peak Flow --      Pain Score 10/28/21 1522 10     Pain Loc --      Pain Edu? --      Excl. in GC? --    No data found.  Updated Vital Signs BP 99/60 (BP Location: Left Arm)   Pulse 86   Temp 98.5 F (36.9 C) (Oral)   Resp 15   SpO2 96%   Breastfeeding Yes   Visual Acuity Right Eye Distance:   Left Eye Distance:   Bilateral Distance:    Right Eye Near:   Left Eye  Near:    Bilateral Near:     Physical Exam Vitals and nursing note reviewed.  Constitutional:      General: She is not in acute distress.    Appearance: Normal appearance. She is not toxic-appearing.  HENT:     Right Ear: Ear canal normal. No drainage or swelling. Tympanic membrane is injected, erythematous and bulging.     Left Ear: Ear canal and external ear normal. A middle ear effusion is present. Tympanic membrane is not erythematous or bulging.     Nose: Nose normal. No congestion.     Mouth/Throat:     Mouth: Mucous membranes are moist.     Pharynx: Oropharynx is clear.  Eyes:     General: No scleral icterus.    Extraocular Movements: Extraocular movements intact.  Cardiovascular:     Rate and Rhythm: Normal rate and regular rhythm.  Pulmonary:     Effort: Pulmonary effort is normal. No respiratory distress.  Skin:    General: Skin is warm and dry.     Coloration: Skin is not jaundiced or pale.     Findings: No erythema.  Neurological:     Mental Status: She is alert and oriented to person, place, and time.  Psychiatric:        Behavior: Behavior is cooperative.      UC Treatments / Results  Labs (all labs ordered are listed, but only abnormal results are displayed) Labs Reviewed - No data to display  EKG   Radiology No results found.  Procedures Procedures (including critical care time)  Medications Ordered in UC Medications - No data to display  Initial Impression / Assessment and Plan / UC Course  I have reviewed the triage vital signs and the nursing notes.  Pertinent labs & imaging results that were available during my care of the patient were reviewed by me and considered in my medical decision making (see chart for details).    Patient is a very pleasant, well-appearing 31 year old female with otitis media of the right ear.  Treat with Augmentin twice daily for 7 days.  Treat pain with Tylenol 1000 mg every 6 hours alternating with ibuprofen 800  mg every 8 hours  as needed.  Seek care if symptoms persist or worsen despite treatment. Final Clinical Impressions(s) / UC Diagnoses   Final diagnoses:  Non-recurrent acute suppurative otitis media of right ear without spontaneous rupture of tympanic membrane     Discharge Instructions      - Please start the Augmentin and take it twice daily for 7 days for the ear infection -You can use ibuprofen 800 mg every 8 hours as needed alternating with Tylenol 1000 mg every 6 hours as needed for pain -Follow-up if your symptoms persist or worsen despite treatment     ED Prescriptions     Medication Sig Dispense Auth. Provider   amoxicillin-clavulanate (AUGMENTIN) 875-125 MG tablet Take 1 tablet by mouth 2 (two) times daily for 7 days. 14 tablet Cathlean Marseilles A, NP   ibuprofen (ADVIL) 800 MG tablet Take 1 tablet (800 mg total) by mouth every 8 (eight) hours as needed. Take with food to prevent GI upset 30 tablet Valentino Nose, NP      PDMP not reviewed this encounter.   Valentino Nose, NP 10/28/21 1549

## 2021-10-28 NOTE — ED Triage Notes (Signed)
Pt reports right ear pain, and feels fluids in the right ear when put a Q tipx for couples months. Ibuprofen gives some relief.

## 2021-10-28 NOTE — Discharge Instructions (Addendum)
-   Please start the Augmentin and take it twice daily for 7 days for the ear infection -You can use ibuprofen 800 mg every 8 hours as needed alternating with Tylenol 1000 mg every 6 hours as needed for pain -Follow-up if your symptoms persist or worsen despite treatment

## 2021-11-05 ENCOUNTER — Encounter: Payer: Self-pay | Admitting: Emergency Medicine

## 2021-11-05 ENCOUNTER — Ambulatory Visit
Admission: EM | Admit: 2021-11-05 | Discharge: 2021-11-05 | Disposition: A | Discharge status: Home / Self Care | Payer: Self-pay | Attending: Family Medicine | Admitting: Family Medicine

## 2021-11-05 ENCOUNTER — Other Ambulatory Visit: Payer: Self-pay

## 2021-11-05 DIAGNOSIS — H60391 Other infective otitis externa, right ear: Secondary | ICD-10-CM

## 2021-11-05 MED ORDER — SULFAMETHOXAZOLE-TRIMETHOPRIM 800-160 MG PO TABS: 1.0000 | ORAL_TABLET | Freq: Two times a day (BID) | ORAL | 0 refills | Status: AC

## 2021-11-05 MED ORDER — CIPROFLOXACIN-DEXAMETHASONE 0.3-0.1 % OT SUSP: 4.0000 [drp] | Freq: Two times a day (BID) | OTIC | 0 refills | Status: DC

## 2021-11-05 NOTE — ED Triage Notes (Addendum)
Pt reports was seen for same last Monday and reports no improvement of symptoms. Pt employer also prescribed pt medication after UC visit. Pt states intermittent intense pain and drainage from right ear last few days. Has been taking ibuprofen and tylenol with no relief of pain. Pt concerned for perforated ear drum.

## 2021-11-05 NOTE — ED Provider Notes (Signed)
[facility]    CSN: [id number] Arrival date & time: 11/05/21  1646      History   Chief Complaint Chief Complaint  Patient presents with   Ear Injury    Entered by patient    HPI Cathy Pineda is a 31 y.o. female.   Presenting today with right outer ear pain, drainage, muffled hearing.  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  She does feel like the drainage has improved but the pain persists and kept her up last night.  Denies loss of hearing, fever, chills, upper respiratory symptoms, injury to the area.  Taking ibuprofen and Tylenol around-the-clock for pain control.    Past Medical History:  Diagnosis Date   Anemia     Patient Active Problem List   Diagnosis Date Noted   Indication for care in labor or delivery 02/14/2021   VBAC, delivered 02/14/2021   Obstetrical laceration - midline episiotomy 02/14/2021   Maternal anemia, with delivery - IDA 02/14/2021   Postpartum care following VBAC 10/20 02/21/2016    Past Surgical History:  Procedure Laterality Date   CESAREAN SECTION N/A 02/21/2016   Procedure: CESAREAN SECTION;  Surgeon: Olivia Mackie, MD;  Location: [facility] BIRTHING SUIT[facility];  Service: Obstetrics;  Laterality: N/A;    OB History     Gravida  2   Para  2   Term  2   Preterm      AB      Living  2      SAB      IAB      Ectopic      Multiple  0   Live Births  2            Home Medications    Prior to Admission medications   Medication Sig Start Date End Date Taking? Authorizing Provider  ciprofloxacin-dexamethasone (CIPRODEX) OTIC suspension Place 4 drops into the right ear 2 (two) times daily. 11/05/21  Yes Particia Nearing, PA-C  sulfamethoxazole-trimethoprim (BACTRIM DS) 800-160 MG tablet Take 1  tablet by mouth 2 (two) times daily for 7 days. 11/05/21 11/12/21 Yes Particia Nearing, PA-C  acetaminophen (TYLENOL) 325 MG tablet Take 2 tablets (650 mg total) by mouth every 4 (four) hours as needed (for pain scale < 4). 02/16/21   June Leap, CNM  benzocaine-Menthol (DERMOPLAST) 20-0.5 % AERO Apply 1 application topically as needed for irritation (perineal discomfort). 02/16/21   June Leap, CNM  escitalopram (LEXAPRO) 10 MG tablet Take 10 mg by mouth daily.    [provider]  ibuprofen (ADVIL) 800 MG tablet Take 1 tablet (800 mg total) by mouth every 8 (eight) hours as needed. Take with food to prevent GI upset 10/28/21   Valentino Nose, NP  iron polysaccharides (FERREX 150) 150 MG capsule Take 1 capsule (150 mg total) by mouth daily. 02/16/21   June Leap, CNM  magnesium oxide (MAG-OX) 400 (240 Mg) MG tablet Take 1 tablet (400 mg total) by mouth daily. 02/17/21   June Leap, CNM  Prenatal Vit-Fe Fumarate-FA (PRENATAL MULTIVITAMIN) TABS tablet Take 1 tablet by mouth at bedtime.    [provider]    Family History History reviewed. No pertinent family history.  Social History  Social History   Tobacco Use   Smoking status: Never   Smokeless tobacco: Never  Substance Use Topics   Alcohol use: Yes   Drug use: No     Allergies   Patient has no known allergies.   Review of Systems Review of Systems Per HPI  Physical Exam Triage Vital Signs ED Triage Vitals  Enc Vitals Group     BP 11/05/21 1656 108/74     Pulse Rate 11/05/21 1656 69     Resp 11/05/21 1656 18     Temp 11/05/21 1656 98.4 F (36.9 C)     Temp Source 11/05/21 1656 Oral     SpO2 11/05/21 1656 98 %     Weight --      Height --      Head Circumference --      Peak Flow --      Pain Score 11/05/21 1657 8     Pain Loc --      Pain Edu? --      Excl. in GC? --    No data found.  Updated Vital Signs BP 108/74 (BP Location: Right Arm)   Pulse 69   Temp 98.4  F (36.9 C) (Oral)   Resp 18   SpO2 98%   Breastfeeding Yes   Visual Acuity Right Eye Distance:   Left Eye Distance:   Bilateral Distance:    Right Eye Near:   Left Eye Near:    Bilateral Near:     Physical Exam Vitals and nursing note reviewed.  Constitutional:      Appearance: Normal appearance. She is not ill-appearing.  HENT:     Head: Atraumatic.     Ears:     Comments: Right external canal erythematous, edematous, crusted and significantly tender to palpation.  Right TM with effusion but no erythema, purulence    Nose: Nose normal.     Mouth/Throat:     Mouth: Mucous membranes are moist.  Eyes:     Extraocular Movements: Extraocular movements intact.     Conjunctiva/sclera: Conjunctivae normal.  Cardiovascular:     Rate and Rhythm: Normal rate and regular rhythm.     Heart sounds: Normal heart sounds.  Pulmonary:     Effort: Pulmonary effort is normal.     Breath sounds: Normal breath sounds.  Musculoskeletal:        General: Normal range of motion.     Cervical back: Normal range of motion and neck supple.  Skin:    General: Skin is warm and dry.  Neurological:     Mental Status: She is alert and oriented to person, place, and time.  Psychiatric:        Mood and Affect: Mood normal.        Thought Content: Thought content normal.        Judgment: Judgment normal.    UC Treatments / Results  Labs (all labs ordered are listed, but only abnormal results are displayed) Labs Reviewed - No data to display  EKG  Radiology No results found.  Procedures Procedures (including critical care time)  Medications Ordered in UC Medications - No data to display  Initial Impression / Assessment and Plan / UC Course  I have reviewed the triage vital signs and the nursing notes.  Pertinent labs & imaging results that were available during my care of the patient were reviewed by me and considered in my medical decision making (see chart for details).     Treat  with Bactrim, Ciprodex and over-the-counter pain relievers.  Return for worsening symptoms.  Final Clinical Impressions(s) / UC Diagnoses   Final diagnoses:  Infective otitis externa of right ear   Discharge Instructions   None    ED Prescriptions     Medication Sig Dispense Auth. Provider   sulfamethoxazole-trimethoprim (BACTRIM DS) 800-160 MG tablet Take 1 tablet by mouth 2 (two) times daily for 7 days. 14 tablet Roosvelt Maser [place], [place]   ciprofloxacin-dexamethasone [facility]) OTIC suspension Place 4 drops into the right ear 2 (two) times daily. 7.5 mL Particia Nearing, [place]      PDMP not reviewed this encounter.   Particia Nearing, [place] 11/05/21 1759

## 2021-11-06 ENCOUNTER — Telehealth: Payer: Self-pay | Admitting: Emergency Medicine

## 2021-11-06 MED ORDER — OFLOXACIN 0.3 % OT SOLN: 10.0000 [drp] | Freq: Every day | OTIC | 0 refills | Status: AC

## 2021-11-06 NOTE — Telephone Encounter (Signed)
Pt called and reported cost of medication too expensive and would like an alternative for ciprodex drops. Consulted NP and reported to e-prescribe ofloxacin 10 drops to right ear daily x7 days. Pt aware.

## 2023-11-23 ENCOUNTER — Other Ambulatory Visit (HOSPITAL_COMMUNITY): Payer: Self-pay | Admitting: Nurse Practitioner

## 2023-11-23 DIAGNOSIS — R7989 Other specified abnormal findings of blood chemistry: Secondary | ICD-10-CM

## 2023-11-23 DIAGNOSIS — M79605 Pain in left leg: Secondary | ICD-10-CM

## 2023-11-25 ENCOUNTER — Ambulatory Visit (HOSPITAL_COMMUNITY): Admission: RE | Admit: 2023-11-25 | Source: Ambulatory Visit

## 2023-11-25 ENCOUNTER — Ambulatory Visit (HOSPITAL_COMMUNITY)
Admission: RE | Admit: 2023-11-25 | Discharge: 2023-11-25 | Disposition: A | Discharge status: Home / Self Care | Payer: PRIVATE HEALTH INSURANCE | Source: Ambulatory Visit | Attending: Nurse Practitioner | Admitting: Nurse Practitioner

## 2023-11-25 ENCOUNTER — Encounter (HOSPITAL_COMMUNITY): Payer: Self-pay

## 2023-11-25 DIAGNOSIS — M79605 Pain in left leg: Secondary | ICD-10-CM | POA: Insufficient documentation

## 2023-11-25 DIAGNOSIS — R7989 Other specified abnormal findings of blood chemistry: Secondary | ICD-10-CM | POA: Insufficient documentation
